# Patient Record
Sex: Male | Born: 1980 | Race: White | Hispanic: No | Marital: Single | Smoking: Former smoker
Health system: Southern US, Community
[De-identification: ages and names within clinical notes are randomized; demographics above are authoritative.]

---

## 2005-07-23 ENCOUNTER — Emergency Department: Payer: Self-pay | Admitting: Emergency Medicine

## 2005-07-30 ENCOUNTER — Emergency Department: Payer: Self-pay | Admitting: General Practice

## 2005-09-25 ENCOUNTER — Emergency Department: Payer: Self-pay | Admitting: Emergency Medicine

## 2005-10-02 ENCOUNTER — Emergency Department: Payer: Self-pay | Admitting: Emergency Medicine

## 2006-01-06 ENCOUNTER — Emergency Department: Payer: Self-pay | Admitting: Internal Medicine

## 2006-03-02 ENCOUNTER — Emergency Department: Payer: Self-pay | Admitting: Emergency Medicine

## 2006-03-09 ENCOUNTER — Emergency Department: Payer: Self-pay | Admitting: Internal Medicine

## 2006-03-11 ENCOUNTER — Ambulatory Visit: Payer: Self-pay | Admitting: Family Medicine

## 2006-03-24 ENCOUNTER — Ambulatory Visit: Payer: Self-pay | Admitting: Family Medicine

## 2006-03-28 ENCOUNTER — Other Ambulatory Visit: Payer: Self-pay

## 2006-03-28 ENCOUNTER — Emergency Department: Payer: Self-pay | Admitting: Internal Medicine

## 2006-03-30 ENCOUNTER — Ambulatory Visit: Payer: Self-pay | Admitting: Internal Medicine

## 2006-03-31 ENCOUNTER — Inpatient Hospital Stay: Payer: Self-pay | Admitting: Internal Medicine

## 2006-04-05 ENCOUNTER — Emergency Department: Payer: Self-pay | Admitting: Emergency Medicine

## 2006-04-17 ENCOUNTER — Other Ambulatory Visit: Payer: Self-pay

## 2006-04-17 ENCOUNTER — Emergency Department: Payer: Self-pay | Admitting: Unknown Physician Specialty

## 2006-08-15 ENCOUNTER — Emergency Department: Payer: Self-pay

## 2006-08-17 ENCOUNTER — Emergency Department: Payer: Self-pay | Admitting: Emergency Medicine

## 2006-08-22 ENCOUNTER — Emergency Department: Payer: Self-pay | Admitting: Emergency Medicine

## 2006-08-24 ENCOUNTER — Emergency Department: Payer: Self-pay | Admitting: Emergency Medicine

## 2006-09-02 ENCOUNTER — Emergency Department: Payer: Self-pay | Admitting: Emergency Medicine

## 2006-09-06 ENCOUNTER — Emergency Department: Payer: Self-pay | Admitting: Emergency Medicine

## 2006-09-06 ENCOUNTER — Other Ambulatory Visit: Payer: Self-pay

## 2006-10-26 ENCOUNTER — Emergency Department: Payer: Self-pay | Admitting: Emergency Medicine

## 2006-11-02 ENCOUNTER — Emergency Department: Payer: Self-pay | Admitting: Emergency Medicine

## 2007-06-07 ENCOUNTER — Emergency Department: Payer: Self-pay | Admitting: Emergency Medicine

## 2007-09-23 ENCOUNTER — Emergency Department: Payer: Self-pay | Admitting: Emergency Medicine

## 2008-05-11 ENCOUNTER — Emergency Department: Payer: Self-pay | Admitting: Emergency Medicine

## 2008-05-12 ENCOUNTER — Emergency Department: Payer: Self-pay | Admitting: Emergency Medicine

## 2008-05-14 ENCOUNTER — Emergency Department: Payer: Self-pay | Admitting: Internal Medicine

## 2008-05-15 ENCOUNTER — Inpatient Hospital Stay: Payer: Self-pay | Admitting: Internal Medicine

## 2008-05-21 ENCOUNTER — Emergency Department: Payer: Self-pay | Admitting: Emergency Medicine

## 2008-10-02 IMAGING — CT CT CERVICAL SPINE WITHOUT CONTRAST
1 series · 12 of 14 positions shown, 15 images · non-contrast
Comparison: none

REASON FOR EXAM: fall/pain
COMMENTS:

[Series 6: axial · axial · 0.19mm/px · z∈[+243,+400]mm · 12 of 100 slices shown, 15 images]
[im 8/100  soft-tissue]
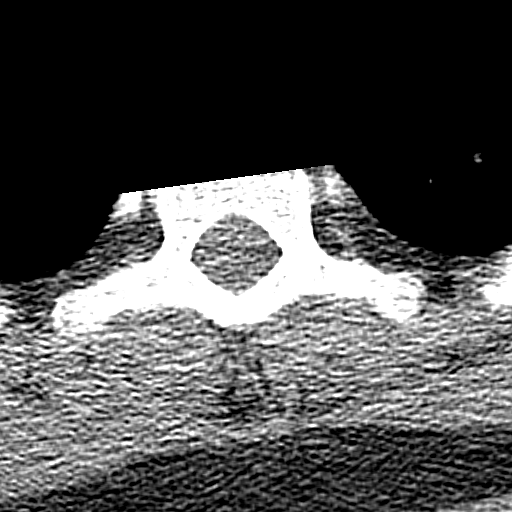
[im 8/100  bone]
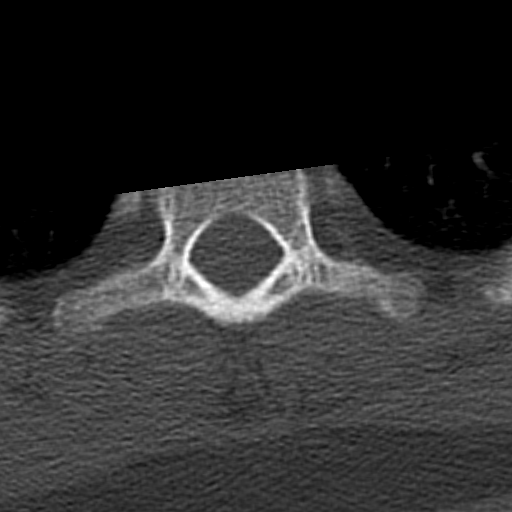
[im 16/100  bone]
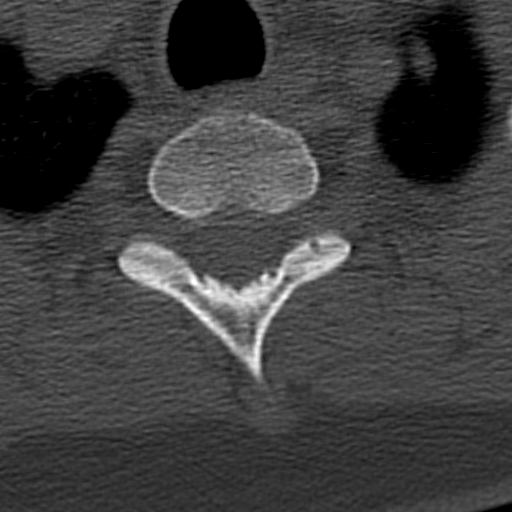
[im 23/100  bone]
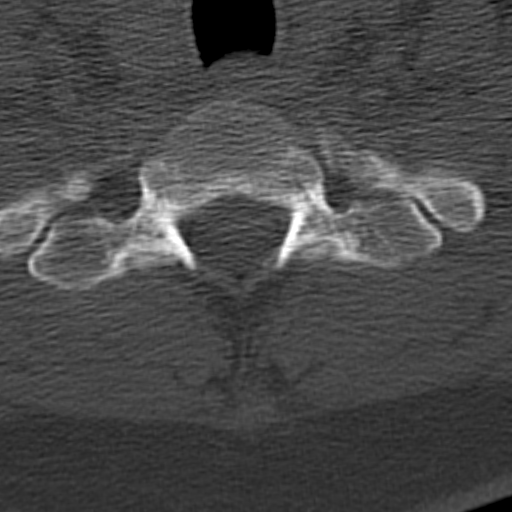
[im 31/100  bone]
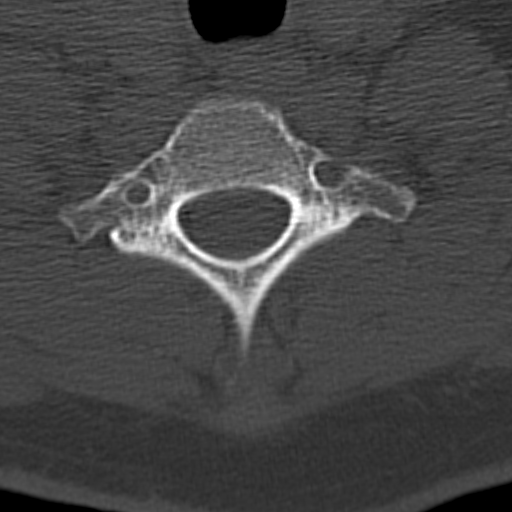
[im 39/100  soft-tissue]
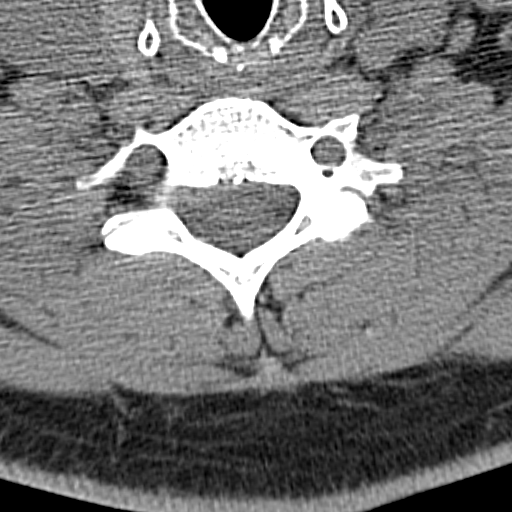
[im 39/100  bone]
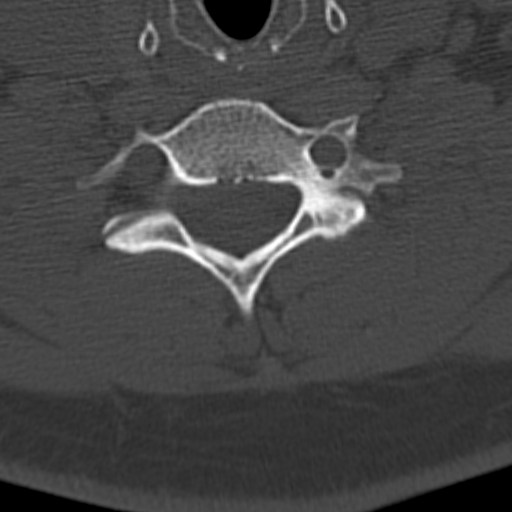
[im 46/100  bone]
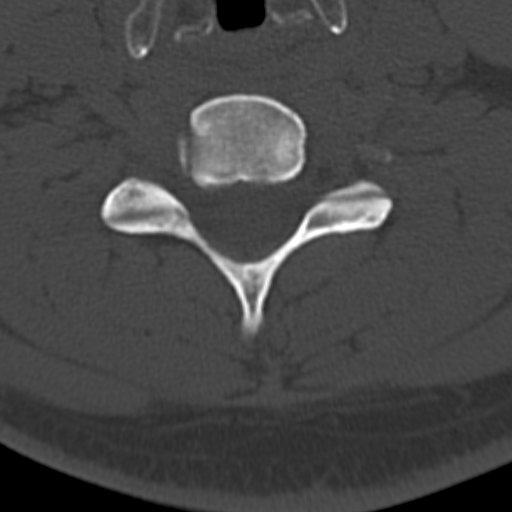
[im 54/100  bone]
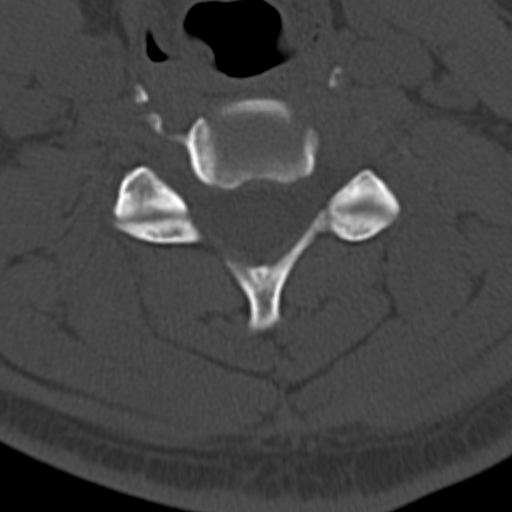
[im 61/100  bone]
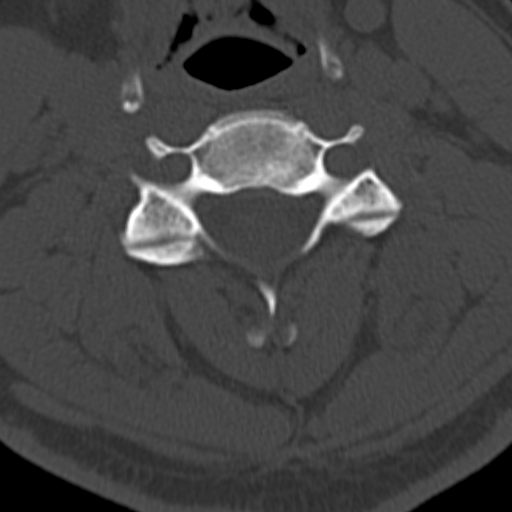
[im 69/100  soft-tissue]
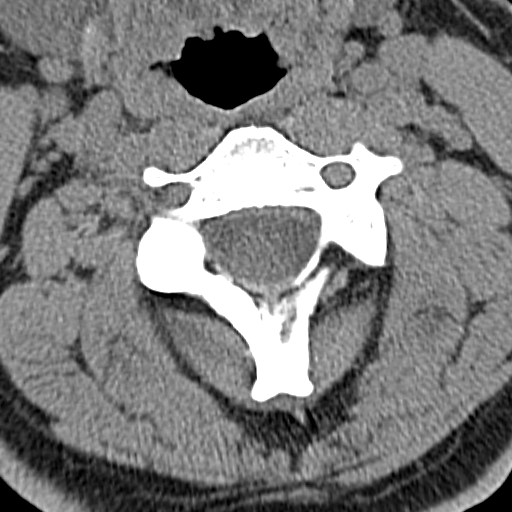
[im 69/100  bone]
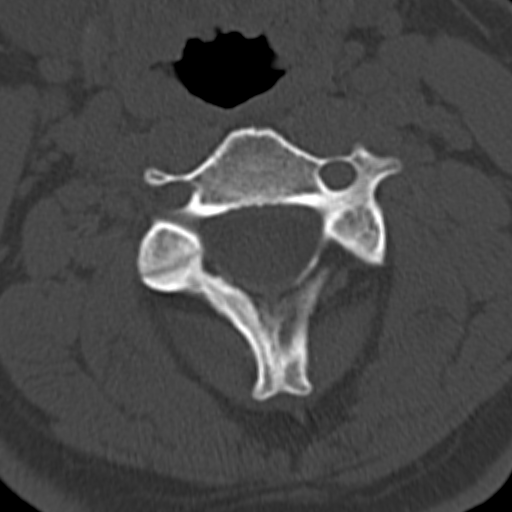
[im 77/100  bone]
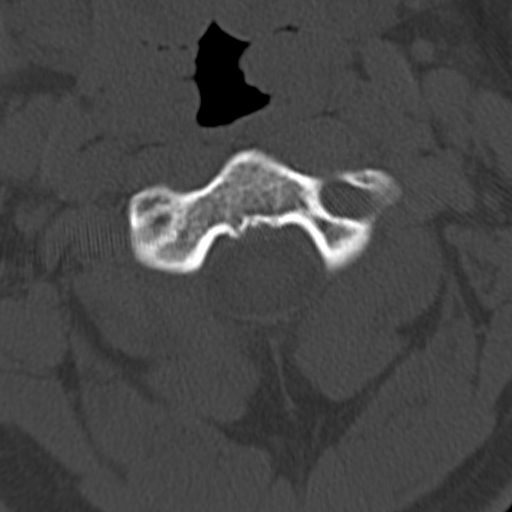
[im 84/100  bone]
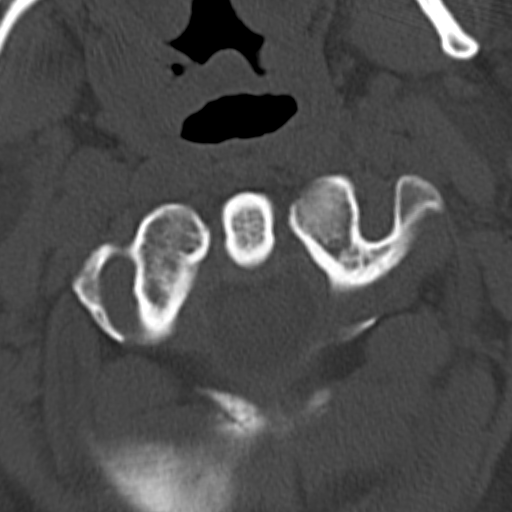
[im 92/100  bone]
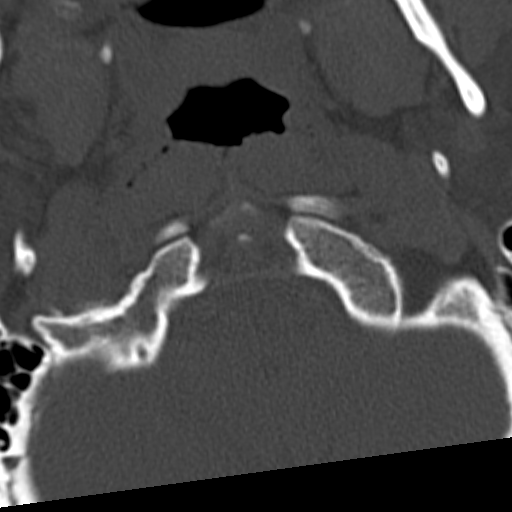

[12 of 14 positions shown; findings below may reference images not displayed]

PROCEDURE:     CT  - CT CERVICAL SPINE WO  - August 22, 2006  [DATE]

RESULT:     CT of the cervical spine is performed. The patient has a prior
study dated 03/28/2006. The study is obtained utilizing a multislight helical
acquisition with axial, coronal and sagittal reconstructions in bone window
setting being submitted. The prevertebral soft tissues and spinal alignment
are normal. The facets appear to be unremarkable. The posterior elements are
intact. The craniocervical junction and spinous processes appear to be
normal.
IMPRESSION: No CT evidence of acute cervical spine fracture. No
subluxation evident. The included upper thoracic spine demonstrates no
definite fracture.

## 2008-10-02 IMAGING — CR DG THORACIC SPINE 2-3V
1 series · 4 of 4 positions shown · non-contrast
Comparison: none

REASON FOR EXAM: fall/pain
COMMENTS:

PROCEDURE:     DXR - DXR THORACIC  AP AND LATERAL  - August 22, 2006  [DATE]
RESULT:     AP and lateral images of the thoracic spine show normal
alignment with no fracture or subluxation.

[Series 1: view not recorded · 0.17mm/px · 4 of 4 slices shown]
[im 1/4]
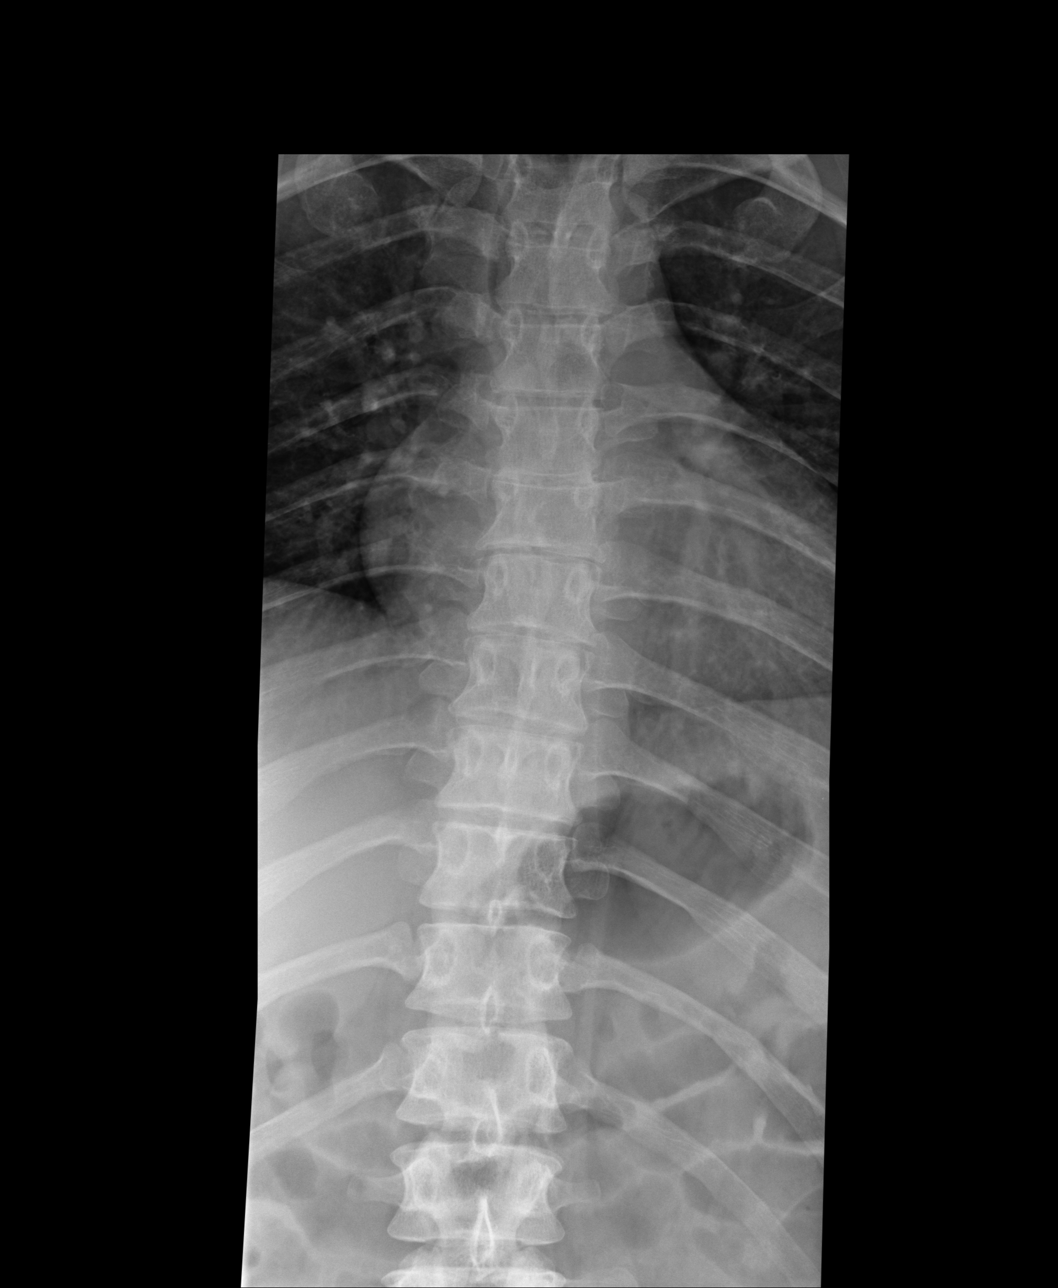
[im 2/4]
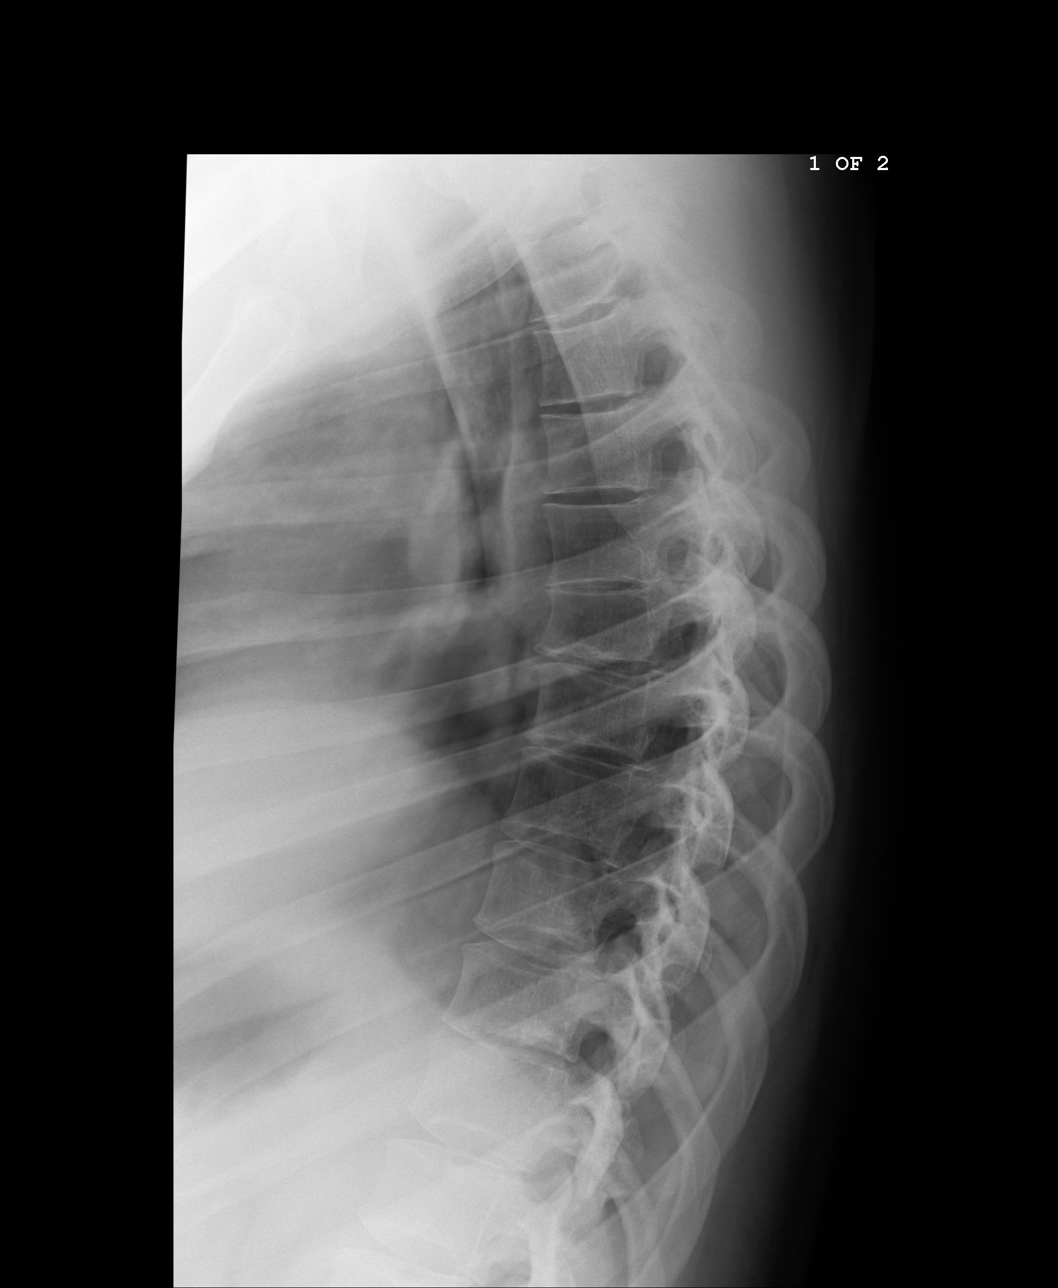
[im 3/4]
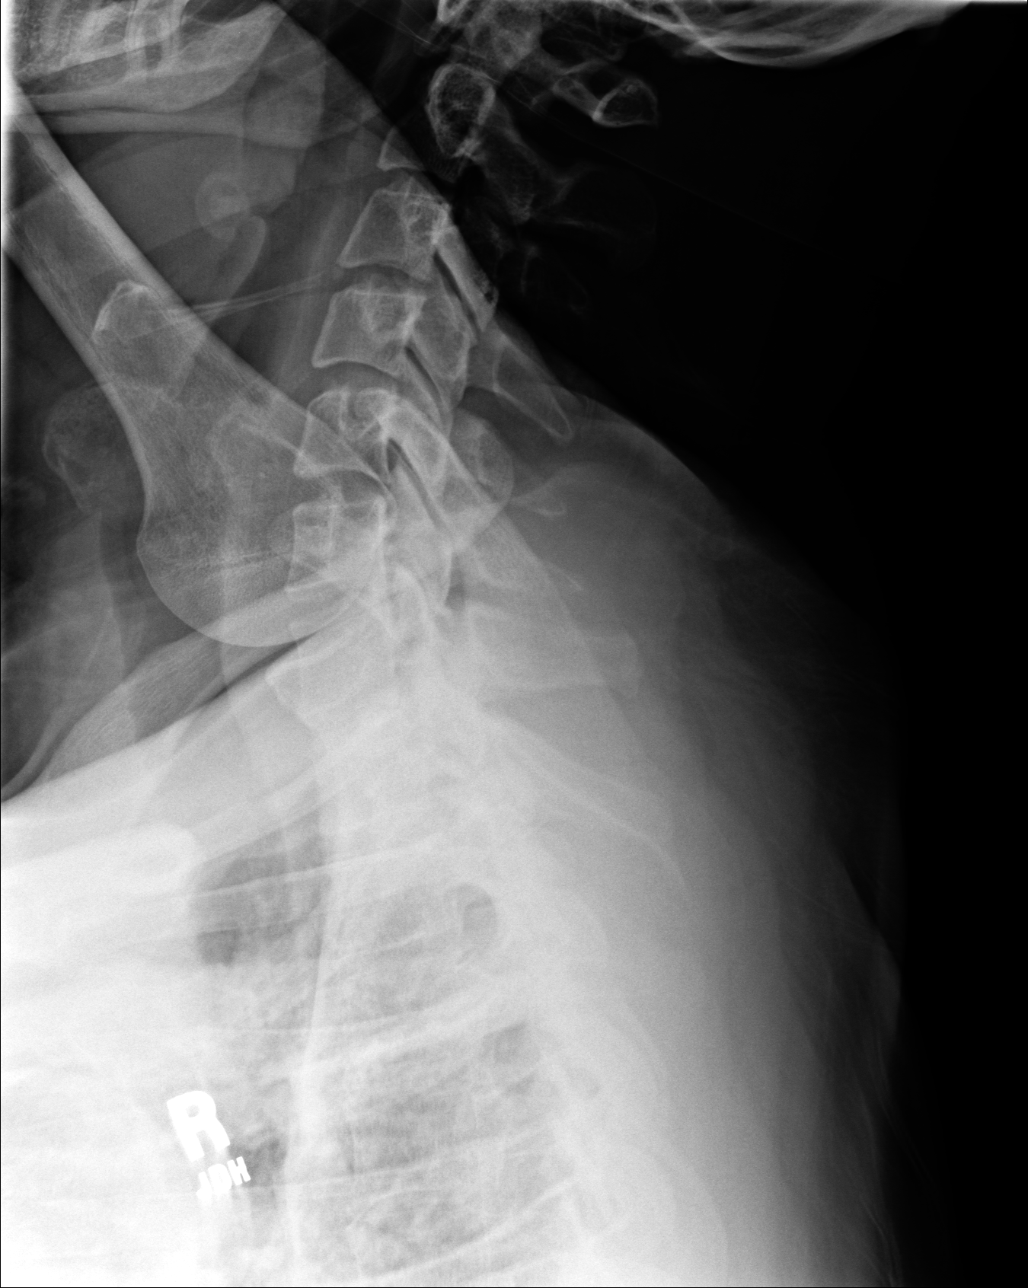
[im 4/4]
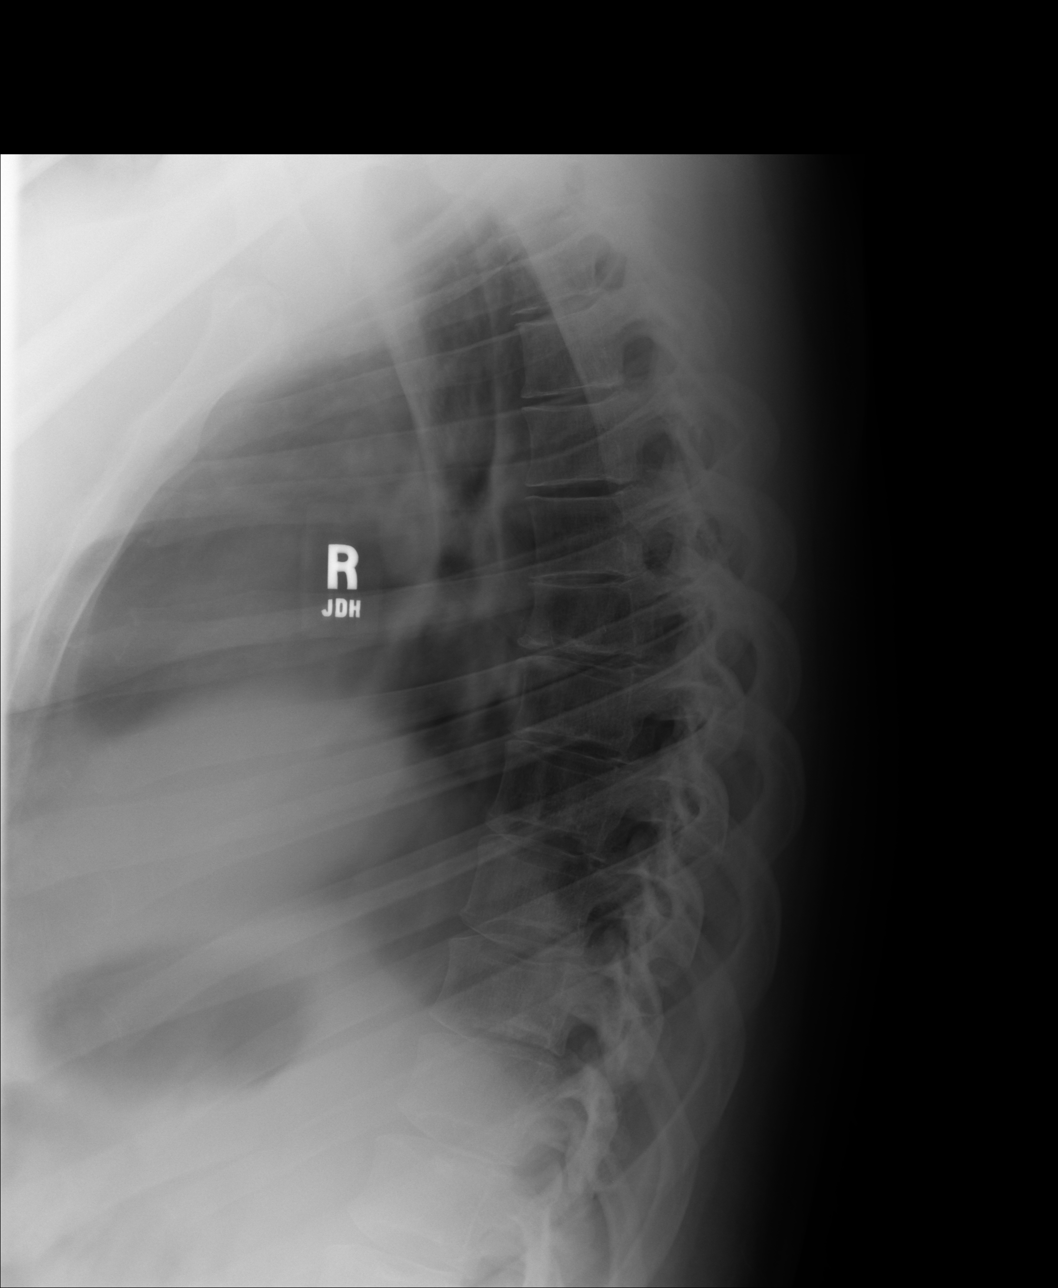

[4 of 4 positions shown; findings below may reference images not displayed]

IMPRESSION: No acute bony abnormality demonstrated.

## 2008-10-02 IMAGING — CR DG LUMBAR SPINE AP/LAT/OBLIQUES W/ FLEX AND EXT
1 series · 5 of 5 positions shown · non-contrast
Comparison: none

REASON FOR EXAM: fall/pain
COMMENTS:

[Series 1: view not recorded · 0.17mm/px · 5 of 5 slices shown]
[im 1/5]
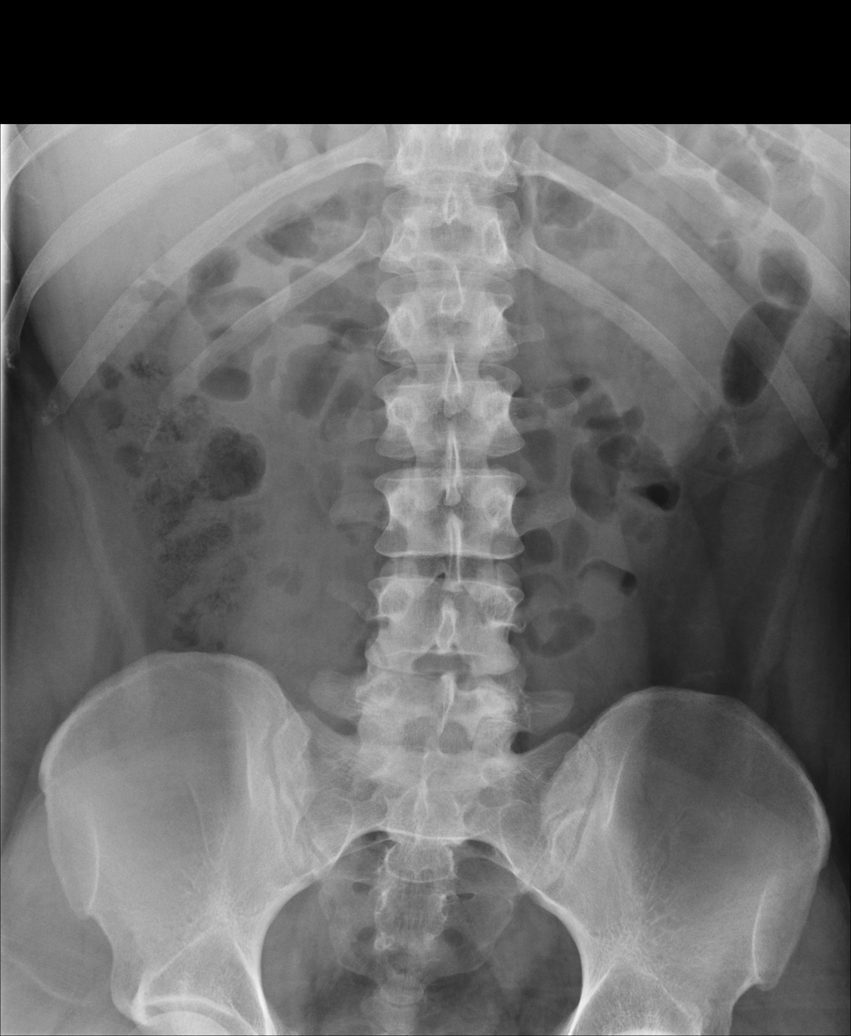
[im 2/5]
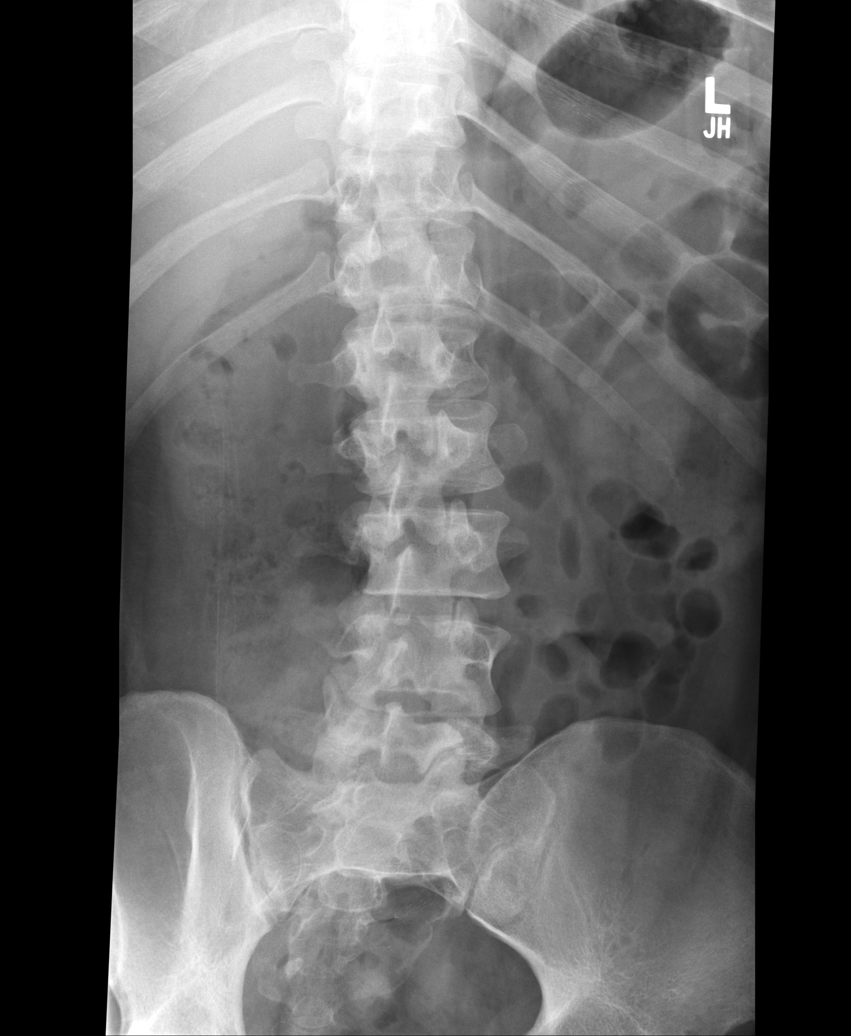
[im 3/5]
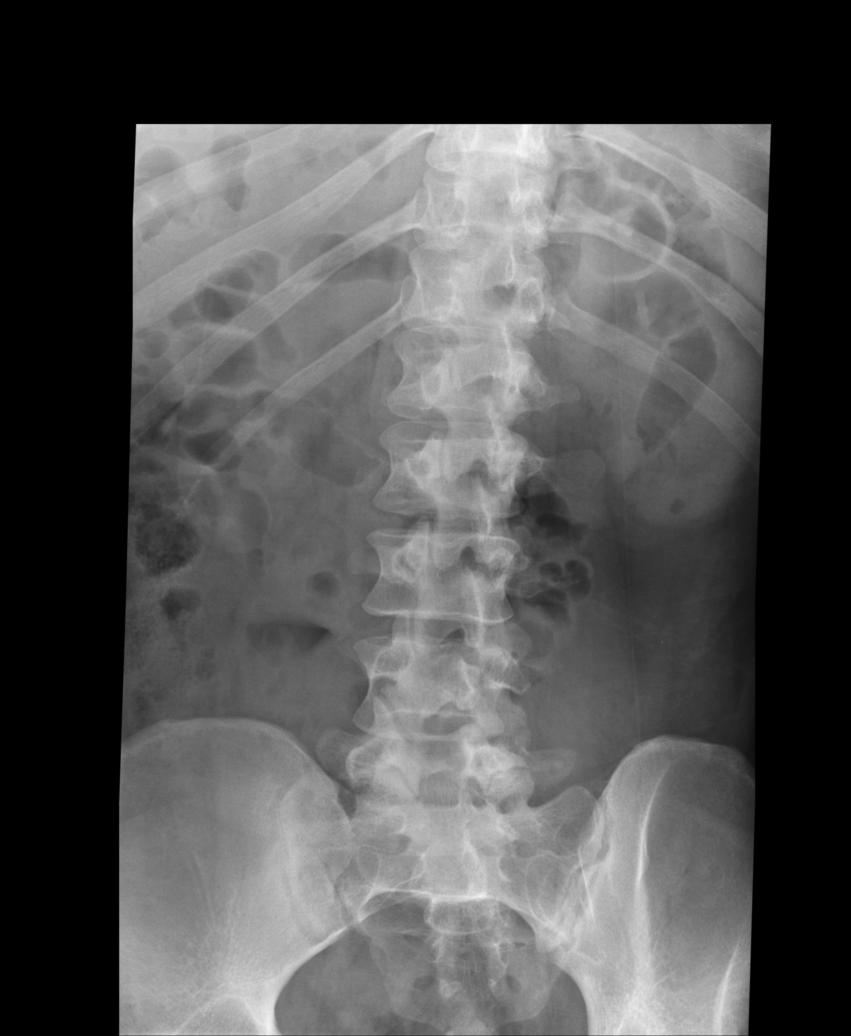
[im 4/5]
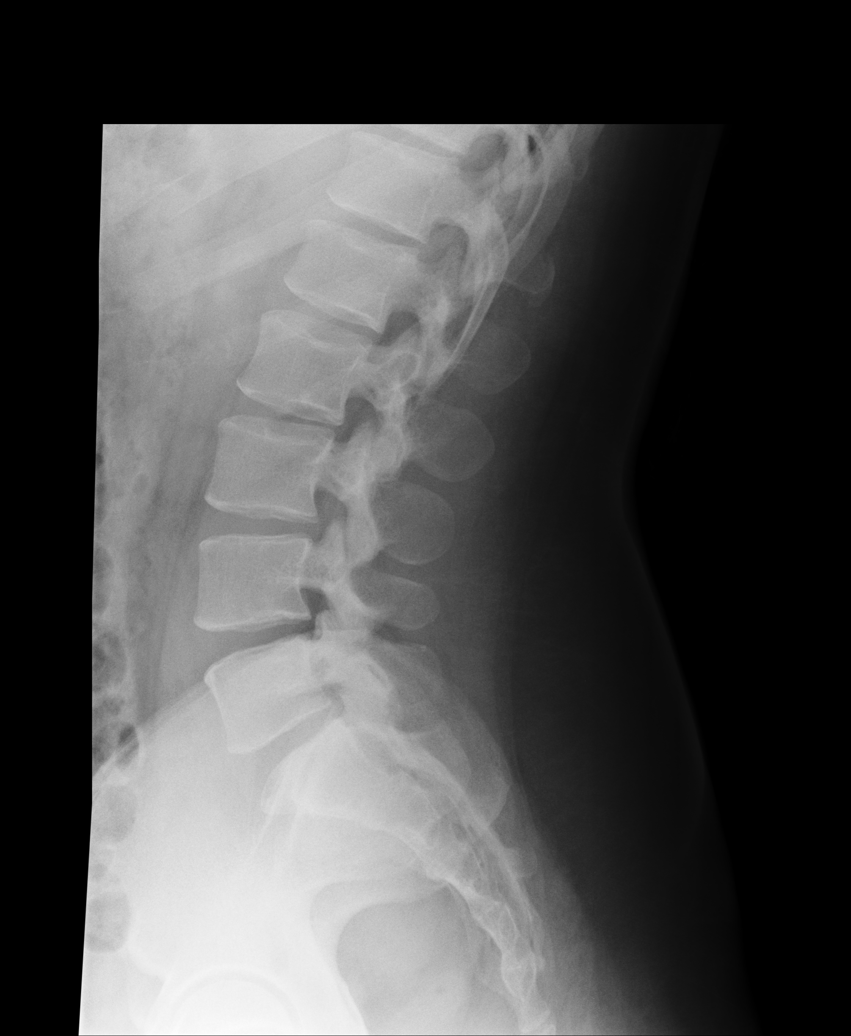
[im 5/5]
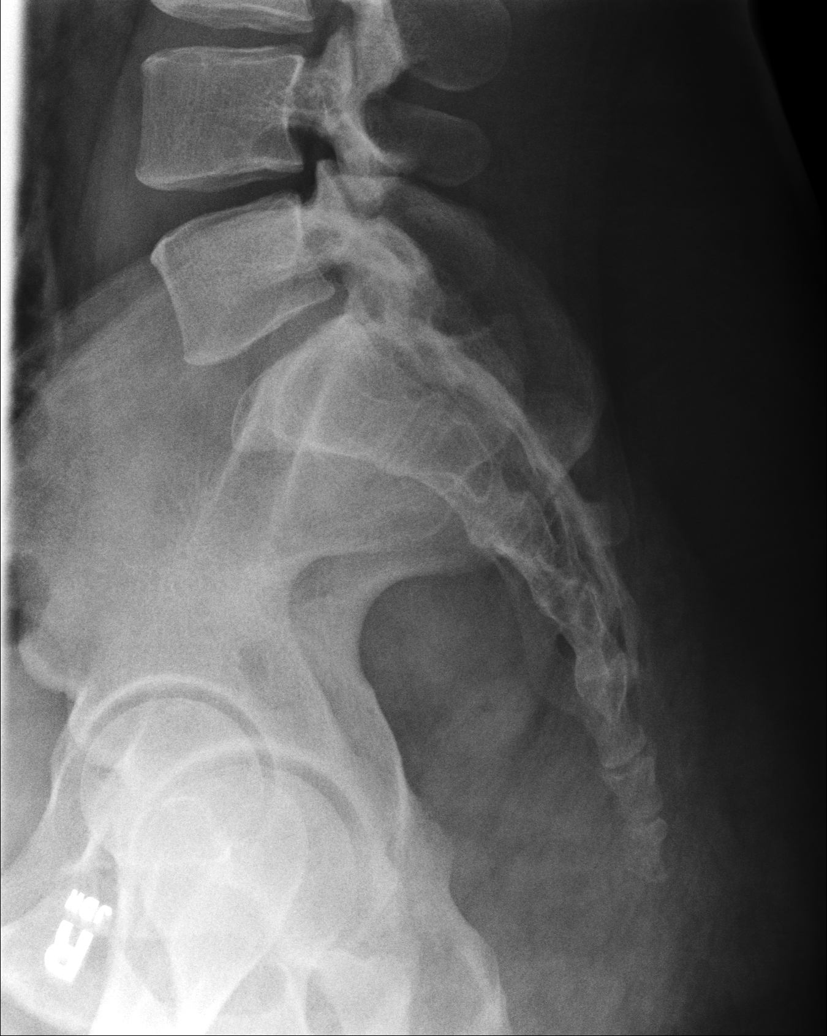

[5 of 5 positions shown; findings below may reference images not displayed]

PROCEDURE:     DXR - DXR LUMBAR SPINE WITH OBLIQUES  - August 22, 2006  [DATE]

RESULT:     Images of the lumbar spine demonstrate normal alignment. There
is minimal anterior wedging at T11 which is a normal variant. No definite
fracture is identified. There is no subluxation. The facets appear to be
normally aligned. The bowel gas pattern is unremarkable.
IMPRESSION: No acute bony abnormality.

## 2009-05-27 ENCOUNTER — Emergency Department: Payer: Self-pay | Admitting: Emergency Medicine

## 2012-06-04 ENCOUNTER — Emergency Department: Payer: Self-pay | Admitting: Emergency Medicine

## 2013-01-20 ENCOUNTER — Emergency Department: Payer: Self-pay | Admitting: Internal Medicine

## 2020-12-05 ENCOUNTER — Other Ambulatory Visit: Payer: Self-pay

## 2020-12-05 ENCOUNTER — Emergency Department: Payer: BC Managed Care – PPO

## 2020-12-05 ENCOUNTER — Emergency Department
Admission: EM | Admit: 2020-12-05 | Discharge: 2020-12-05 | Disposition: A | Discharge status: Home / Self Care | Payer: BC Managed Care – PPO | Attending: Emergency Medicine | Admitting: Emergency Medicine

## 2020-12-05 DIAGNOSIS — R0789 Other chest pain: Secondary | ICD-10-CM | POA: Insufficient documentation

## 2020-12-05 DIAGNOSIS — Z87891 Personal history of nicotine dependence: Secondary | ICD-10-CM | POA: Diagnosis not present

## 2020-12-05 DIAGNOSIS — R945 Abnormal results of liver function studies: Secondary | ICD-10-CM | POA: Diagnosis not present

## 2020-12-05 DIAGNOSIS — R1011 Right upper quadrant pain: Secondary | ICD-10-CM | POA: Diagnosis present

## 2020-12-05 DIAGNOSIS — R748 Abnormal levels of other serum enzymes: Secondary | ICD-10-CM

## 2020-12-05 LAB — CBC
HCT: 38.4 % — ABNORMAL LOW (ref 39.0–52.0)
Hemoglobin: 13.8 g/dL (ref 13.0–17.0)
MCH: 32.7 pg (ref 26.0–34.0)
MCHC: 35.9 g/dL (ref 30.0–36.0)
MCV: 91 fL (ref 80.0–100.0)
Platelets: 98 10*3/uL — ABNORMAL LOW (ref 150–400)
RBC: 4.22 MIL/uL (ref 4.22–5.81)
RDW: 15.3 % (ref 11.5–15.5)
WBC: 5.5 10*3/uL (ref 4.0–10.5)
nRBC: 0 % (ref 0.0–0.2)

## 2020-12-05 LAB — HEPATIC FUNCTION PANEL
ALT: 31 U/L (ref 0–44)
AST: 87 U/L — ABNORMAL HIGH (ref 15–41)
Albumin: 4 g/dL (ref 3.5–5.0)
Alkaline Phosphatase: 148 U/L — ABNORMAL HIGH (ref 38–126)
Bilirubin, Direct: 1.9 mg/dL — ABNORMAL HIGH (ref 0.0–0.2)
Indirect Bilirubin: 2.4 mg/dL — ABNORMAL HIGH (ref 0.3–0.9)
Total Bilirubin: 4.3 mg/dL — ABNORMAL HIGH (ref 0.3–1.2)
Total Protein: 8.2 g/dL — ABNORMAL HIGH (ref 6.5–8.1)

## 2020-12-05 LAB — TROPONIN I (HIGH SENSITIVITY)
Troponin I (High Sensitivity): 4 ng/L (ref <18)
Troponin I (High Sensitivity): 2 ng/L (ref <18)

## 2020-12-05 LAB — BASIC METABOLIC PANEL
Anion gap: 10 (ref 5–15)
BUN: 6 mg/dL (ref 6–20)
CO2: 28 mmol/L (ref 22–32)
Calcium: 9.2 mg/dL (ref 8.9–10.3)
Chloride: 96 mmol/L — ABNORMAL LOW (ref 98–111)
Creatinine, Ser: 0.93 mg/dL (ref 0.61–1.24)
GFR, Estimated: >60 mL/min (ref >60)
Glucose, Bld: 127 mg/dL — ABNORMAL HIGH (ref 70–99)
Potassium: 3.3 mmol/L — ABNORMAL LOW (ref 3.5–5.1)
Sodium: 134 mmol/L — ABNORMAL LOW (ref 135–145)

## 2020-12-05 LAB — LIPASE, BLOOD: Lipase: 30 U/L (ref 11–51)

## 2020-12-05 MED ORDER — LIDOCAINE VISCOUS HCL 2 % MT SOLN
15.0000 mL | Freq: Once | OROMUCOSAL | Status: AC
  Administered 2020-12-05: 15 mL via ORAL
  Filled 2020-12-05: qty 15

## 2020-12-05 MED ORDER — IOHEXOL 350 MG/ML SOLN
100.0000 mL | Freq: Once | INTRAVENOUS | Status: AC | PRN
  Administered 2020-12-05: 100 mL via INTRAVENOUS
  Filled 2020-12-05: qty 100

## 2020-12-05 MED ORDER — ALUM & MAG HYDROXIDE-SIMETH 200-200-20 MG/5ML PO SUSP
30.0000 mL | Freq: Once | ORAL | Status: AC
  Administered 2020-12-05: 30 mL via ORAL
  Filled 2020-12-05: qty 30

## 2020-12-05 MED ORDER — PANTOPRAZOLE SODIUM 40 MG PO TBEC: 40.0000 mg | DELAYED_RELEASE_TABLET | Freq: Every day | ORAL | 0 refills | Status: DC

## 2020-12-05 MED ORDER — ONDANSETRON 4 MG PO TBDP: 4.0000 mg | ORAL_TABLET | Freq: Three times a day (TID) | ORAL | 0 refills | Status: DC | PRN

## 2020-12-05 NOTE — Discharge Instructions (Addendum)
As we discussed, your pain could very well be due to gallstones.  I have attached instructions on how to adjust her diet to reduce the likelihood of this being from gallstones.  Your liver enzymes were elevated as well as her bilirubin.  I suspect this is related to underlying liver disease from alcohol use as well as possibly diet.  That being said, it is very important to follow-up with a GI doctor for close follow-up and evaluation for possible cirrhosis.  You should do this within the next several weeks.  I provided a follow-up appointment number.  Otherwise, try to avoid spicy foods and foods high in fat.  Take the antacid.  You can take over-the-counter Tums as well for pain.  AVOID tylenol, alcohol

## 2020-12-05 NOTE — ED Provider Notes (Signed)
[facility] Emergency Department Provider Note  ____________________________________________   Event Date/Time   First MD Initiated Contact with Patient 12/05/20 [phone]     (approximate)  I have reviewed the triage vital signs and the nursing notes.   HISTORY  Chief Complaint Chest Pain    HPI Anthony Torres is a 40 y.o. male here with chest pain.  The patient states that starting yesterday, he developed a dull, pressure-like chest pain with some associated nausea.  This began after eating at work and persisted intermittently throughout the day.  It was not necessarily associated with exertion or even eating.  Patient states that he went back to work today, and while he did not have chest pain initially, he did develop fairly cute onset of feeling of lightheadedness, along with some tingling in his bilateral arms.  He also had some mild, aching, chest pain that radiated to his back.  This has been fairly persistent.  Denies history of similar issues.  No known family history of early coronary disease.  He does note that he has been under recent stress.  He does take NSAIDs as well regularly with history of reflux but denies known history of ulcers.  No melena.  He currently feels almost back to baseline spontaneously.  No fevers.  No chills.  No leg swelling.  No history of PE.    History reviewed. No pertinent past medical history.  There are no problems to display for this patient.   History reviewed. No pertinent surgical history.  Prior to Admission medications   Medication Sig Start Date End Date Taking? Authorizing Provider  ondansetron (ZOFRAN ODT) 4 MG disintegrating tablet Take 1 tablet (4 mg total) by mouth every 8 (eight) hours as needed for nausea or vomiting. 12/05/20  Yes Shaune Pollack, MD  pantoprazole (PROTONIX) 40 MG tablet Take 1 tablet (40 mg total) by mouth daily for 14 days. 12/05/20 12/19/20 Yes Shaune Pollack, MD     Allergies Patient has no allergy information on record.  History reviewed. No pertinent family history.  Social History Social History   Tobacco Use   Smoking status: Former    Types: Cigarettes    Quit date: 2019    Years since quitting: 3.8   Smokeless tobacco: Never  Vaping Use   Vaping Use: Never used  Substance Use Topics   Alcohol use: Yes   Drug use: Never    Review of Systems  Review of Systems  Constitutional:  Negative for chills and fever.  HENT:  Negative for sore throat.   Respiratory:  Positive for chest tightness and shortness of breath.   Cardiovascular:  Positive for chest pain.  Gastrointestinal:  Negative for abdominal pain.  Genitourinary:  Negative for flank pain.  Musculoskeletal:  Negative for neck pain.  Skin:  Negative for rash and wound.  Allergic/Immunologic: Negative for immunocompromised state.  Neurological:  Negative for weakness and numbness.  Hematological:  Does not bruise/bleed easily.  All other systems reviewed and are negative.   ____________________________________________  PHYSICAL EXAM:      VITAL SIGNS: ED Triage Vitals  Enc Vitals Group     BP 12/05/20 0844 (!) 149/94     Pulse Rate 12/05/20 0844 96     Resp 12/05/20 0844 18     Temp 12/05/20 0844 98.5 F (36.9 C)     Temp Source 12/05/20 0844 Oral     SpO2 12/05/20 0844 98 %     Weight 12/05/20 6433  280 lb (127 kg)     Height 12/05/20 0838 5\' 10"  (1.778 m)     Head Circumference --      Peak Flow --      Pain Score 12/05/20 0844 6     Pain Loc --      Pain Edu? --      Excl. in GC? --      Physical Exam Vitals and nursing note reviewed.  Constitutional:      General: He is not in acute distress.    Appearance: He is well-developed.  HENT:     Head: Normocephalic and atraumatic.  Eyes:     Conjunctiva/sclera: Conjunctivae normal.  Cardiovascular:     Rate and Rhythm: Normal rate and regular rhythm.     Heart sounds: Normal heart sounds. No murmur  heard.   No friction rub.  Pulmonary:     Effort: Pulmonary effort is normal. No respiratory distress.     Breath sounds: Normal breath sounds. No wheezing or rales.  Abdominal:     General: There is no distension.     Palpations: Abdomen is soft.     Tenderness: There is no abdominal tenderness.  Musculoskeletal:     Cervical back: Neck supple.  Skin:    General: Skin is warm.     Capillary Refill: Capillary refill takes less than 2 seconds.  Neurological:     Mental Status: He is alert and oriented to person, place, and time.     Motor: No abnormal muscle tone.      ____________________________________________   LABS (all labs ordered are listed, but only abnormal results are displayed)  Labs Reviewed  BASIC METABOLIC PANEL - Abnormal; Notable for the following components:      Result Value   Sodium 134 (*)    Potassium 3.3 (*)    Chloride 96 (*)    Glucose, Bld 127 (*)    All other components within normal limits  CBC - Abnormal; Notable for the following components:   HCT 38.4 (*)    Platelets 98 (*)    All other components within normal limits  HEPATIC FUNCTION PANEL - Abnormal; Notable for the following components:   Total Protein 8.2 (*)    AST 87 (*)    Alkaline Phosphatase 148 (*)    Total Bilirubin 4.3 (*)    Bilirubin, Direct 1.9 (*)    Indirect Bilirubin 2.4 (*)    All other components within normal limits  LIPASE, BLOOD  TROPONIN I (HIGH SENSITIVITY)  TROPONIN I (HIGH SENSITIVITY)    ____________________________________________  EKG: Normal sinus rhythm, ventricular rate 88.  PR 146, QRS 90, QTc 452.  No acute ST elevations.  Patient does have some nonspecific ST depressions in the lateral precordial leads without concomitant changes.  This appears different from his prior EKG  __________________  RADIOLOGY All imaging, including plain films, CT scans, and ultrasounds, independently reviewed by me, and interpretations confirmed via formal  radiology reads.  ED MD interpretation:   Chest x-ray: No acute disease.  Official radiology report(s): DG Chest 2 View  Result Date: 12/05/2020 CLINICAL DATA:  Chest pain EXAM: CHEST - 2 VIEW COMPARISON:  None. FINDINGS: The heart size and mediastinal contours are within normal limits. Both lungs are clear. The visualized skeletal structures are unremarkable. IMPRESSION: No active cardiopulmonary disease. Reading location: [place], [place]. Electronically Signed   By: [place]   On: 12/05/2020 09:30   CT Angio Chest/Abd/Pel for Dissection W  and/or Wo Contrast  Result Date: 12/05/2020 CLINICAL DATA:  Chest and back pain, aortic dissection suspected EXAM: CT ANGIOGRAPHY CHEST, ABDOMEN AND PELVIS TECHNIQUE: Non-contrast CT of the chest was initially obtained. Multidetector CT imaging through the chest, abdomen and pelvis was performed using the standard protocol during bolus administration of intravenous contrast. Multiplanar reconstructed images and MIPs were obtained and reviewed to evaluate the vascular anatomy. CONTRAST:  OMNIPAQUE IOHEXOL 350 MG/ML SOLN COMPARISON:  None. FINDINGS: CTA CHEST FINDINGS Cardiovascular: Preferential opacification of the thoracic aorta. Normal contour and caliber of the thoracic aorta. No evidence of aneurysm, dissection, or other acute aortic pathology. No significant atherosclerosis. Normal heart size. No pericardial effusion. Mediastinum/Nodes: No enlarged mediastinal, hilar, or axillary lymph nodes. Thyroid gland, trachea, and esophagus demonstrate no significant findings. Lungs/Pleura: Lungs are clear. No pleural effusion or pneumothorax. Musculoskeletal: Bilateral gynecomastia. No acute or significant osseous findings. Review of the MIP images confirms the above findings. CTA ABDOMEN AND PELVIS FINDINGS VASCULAR Normal contour and caliber of the abdominal aorta. No evidence of aneurysm, dissection, or other aortic pathology. Standard branching  pattern of the abdominal aorta, with solitary bilateral renal arteries. No significant atherosclerosis. Review of the MIP images confirms the above findings. NON-VASCULAR Hepatobiliary: Hepatomegaly, maximum coronal span 26.1 cm, and hepatic steatosis. Somewhat coarse, nodular contour of the liver. Gallstones in the gallbladder. No gallbladder wall thickening or biliary ductal dilatation. Pancreas: Unremarkable. No pancreatic ductal dilatation or surrounding inflammatory changes. Spleen: Splenomegaly, maximum coronal span 17.7 cm. Adrenals/Urinary Tract: Adrenal glands are unremarkable. Kidneys are normal, without renal calculi, solid lesion, or hydronephrosis. Bladder is unremarkable. Stomach/Bowel: Stomach is within normal limits. Appendix appears normal. No evidence of bowel wall thickening, distention, or inflammatory changes. Lymphatic: No enlarged abdominal or pelvic lymph nodes. Reproductive: No mass or other significant abnormality. Other: Small, fat containing right inguinal hernia. No abdominopelvic ascites. Musculoskeletal: No acute or significant osseous findings. Review of the MIP images confirms the above findings. IMPRESSION: 1. Normal contour and caliber of the thoracic and abdominal aorta. No evidence of aneurysm, dissection, or other acute aortic pathology. No significant atherosclerosis. 2. Hepatomegaly and hepatic steatosis. Somewhat coarse, nodular contour of the liver, suggestive of cirrhosis. 3. Splenomegaly. 4. Cholelithiasis without evidence of acute cholecystitis. Electronically Signed   By: Jearld Lesch M.D.   On: 12/05/2020 11:46    ____________________________________________  PROCEDURES   Procedure(s) performed (including Critical Care):  Procedures  ____________________________________________  INITIAL IMPRESSION / MDM / ASSESSMENT AND PLAN / ED COURSE  As part of my medical decision making, I reviewed the following data within the electronic MEDICAL RECORD NUMBER Nursing  notes reviewed and incorporated, Old chart reviewed, Notes from prior ED visits, and [place] Controlled Substance Database       *Anthony Torres was evaluated in Emergency Department on 12/05/2020 for the symptoms described in the history of present illness. He was evaluated in the context of the global COVID-19 pandemic, which necessitated consideration that the patient might be at risk for infection with the SARS-CoV-2 virus that causes COVID-19. Institutional protocols and algorithms that pertain to the evaluation of patients at risk for COVID-19 are in a state of rapid change based on information released by regulatory bodies including the CDC and federal and state organizations. These policies and algorithms were followed during the patient's care in the ED.  Some ED evaluations and interventions may be delayed as a result of limited staffing during the pandemic.*     Medical Decision Making: 40 year old male with  past medical history as above here with fairly atypical chest pain.  Patient has had resolution here in the ED.  EKG does show some nonspecific changes but troponins negative x2 with low risk heart score and I do not suspect ACS clinically.  More likely, I suspect patient may have gastritis or GI related pain.  He does have cholelithiasis noted on CT scan, as well as slight elevation of AST and bilirubin but patient also has a history of fairly heavy alcohol use and poor diet which I suspect has caused alcoholic as well as possibly steatosis of the liver, and he has no right upper quadrant tenderness or evidence of Murphy sign.  Given the location of his pain and radiation to his back with initial hypertension, differential included dissection for which the dissection study was obtained.  I independently reviewed this which is overall unremarkable.  He has no evidence of coronary atherosclerosis here.  Gallbladder findings and hepatomegaly with findings of cirrhosis as above.  Chest x-ray clear.   Lipase normal.  Patient has had resolution of symptoms after GI cocktail.  As mentioned, given negative cardiac work-up, telemetry, troponins, do not suspect cardiac etiology and I suspect this is GI and possibly related to his underlying liver disease.  I discussed his liver findings with him in detail and refer him to GI for close follow-up.  He has already worked on decreasing his alcohol intake and Raunel continue to do so.  Avoid Tylenol.  Return precautions given.  ____________________________________________  FINAL CLINICAL IMPRESSION(S) / ED DIAGNOSES  Final diagnoses:  RUQ pain  Atypical chest pain  Abnormal liver enzymes     MEDICATIONS GIVEN DURING THIS VISIT:  Medications  alum & mag hydroxide-simeth (MAALOX/MYLANTA) 200-200-20 MG/5ML suspension 30 mL (30 mLs Oral Given 12/05/20 1029)    And  lidocaine (XYLOCAINE) 2 % viscous mouth solution 15 mL (15 mLs Oral Given 12/05/20 1029)  iohexol (OMNIPAQUE) 350 MG/ML injection 100 mL (100 mLs Intravenous Contrast Given 12/05/20 1111)     ED Discharge Orders          Ordered    pantoprazole (PROTONIX) 40 MG tablet  Daily        12/05/20 1313    ondansetron (ZOFRAN ODT) 4 MG disintegrating tablet  Every 8 hours PRN        12/05/20 1315             Note:  This document was prepared using Dragon voice recognition software and may include unintentional dictation errors.   Shaune Pollack, MD 12/05/20 [phone]

## 2020-12-05 NOTE — ED Triage Notes (Signed)
Pt arrived via pov from home. Pt reports sharp CP starting at 0630 this morning. Pain located in left chest and radiating to back. Ambulatory to triage, NAD noted at this time

## 2021-05-26 ENCOUNTER — Other Ambulatory Visit: Payer: Self-pay

## 2021-05-26 ENCOUNTER — Encounter: Payer: Self-pay | Admitting: Emergency Medicine

## 2021-05-26 ENCOUNTER — Emergency Department: Payer: BC Managed Care – PPO

## 2021-05-26 ENCOUNTER — Inpatient Hospital Stay
Admission: EM | Admit: 2021-05-26 | Discharge: 2021-05-31 | DRG: 433 | Disposition: A | Discharge status: Home / Self Care | Payer: BC Managed Care – PPO | Attending: Internal Medicine | Admitting: Internal Medicine

## 2021-05-26 DIAGNOSIS — E871 Hypo-osmolality and hyponatremia: Secondary | ICD-10-CM | POA: Diagnosis present

## 2021-05-26 DIAGNOSIS — D72828 Other elevated white blood cell count: Secondary | ICD-10-CM | POA: Diagnosis not present

## 2021-05-26 DIAGNOSIS — D638 Anemia in other chronic diseases classified elsewhere: Secondary | ICD-10-CM | POA: Diagnosis present

## 2021-05-26 DIAGNOSIS — K7011 Alcoholic hepatitis with ascites: Secondary | ICD-10-CM | POA: Diagnosis present

## 2021-05-26 DIAGNOSIS — E538 Deficiency of other specified B group vitamins: Secondary | ICD-10-CM | POA: Diagnosis present

## 2021-05-26 DIAGNOSIS — D539 Nutritional anemia, unspecified: Secondary | ICD-10-CM | POA: Diagnosis present

## 2021-05-26 DIAGNOSIS — E8809 Other disorders of plasma-protein metabolism, not elsewhere classified: Secondary | ICD-10-CM | POA: Diagnosis present

## 2021-05-26 DIAGNOSIS — K760 Fatty (change of) liver, not elsewhere classified: Secondary | ICD-10-CM | POA: Diagnosis present

## 2021-05-26 DIAGNOSIS — K649 Unspecified hemorrhoids: Secondary | ICD-10-CM | POA: Diagnosis present

## 2021-05-26 DIAGNOSIS — F1010 Alcohol abuse, uncomplicated: Secondary | ICD-10-CM | POA: Diagnosis not present

## 2021-05-26 DIAGNOSIS — E869 Volume depletion, unspecified: Secondary | ICD-10-CM | POA: Diagnosis present

## 2021-05-26 DIAGNOSIS — R160 Hepatomegaly, not elsewhere classified: Secondary | ICD-10-CM | POA: Diagnosis present

## 2021-05-26 DIAGNOSIS — Z87891 Personal history of nicotine dependence: Secondary | ICD-10-CM | POA: Diagnosis not present

## 2021-05-26 DIAGNOSIS — D538 Other specified nutritional anemias: Secondary | ICD-10-CM | POA: Diagnosis present

## 2021-05-26 DIAGNOSIS — K766 Portal hypertension: Secondary | ICD-10-CM | POA: Diagnosis present

## 2021-05-26 DIAGNOSIS — Z85840 Personal history of malignant neoplasm of eye: Secondary | ICD-10-CM | POA: Diagnosis not present

## 2021-05-26 DIAGNOSIS — R17 Unspecified jaundice: Secondary | ICD-10-CM

## 2021-05-26 DIAGNOSIS — E876 Hypokalemia: Secondary | ICD-10-CM | POA: Diagnosis present

## 2021-05-26 DIAGNOSIS — D72829 Elevated white blood cell count, unspecified: Secondary | ICD-10-CM | POA: Diagnosis not present

## 2021-05-26 DIAGNOSIS — K8020 Calculus of gallbladder without cholecystitis without obstruction: Secondary | ICD-10-CM | POA: Diagnosis present

## 2021-05-26 DIAGNOSIS — T380X5A Adverse effect of glucocorticoids and synthetic analogues, initial encounter: Secondary | ICD-10-CM | POA: Diagnosis not present

## 2021-05-26 DIAGNOSIS — F1020 Alcohol dependence, uncomplicated: Secondary | ICD-10-CM | POA: Diagnosis present

## 2021-05-26 DIAGNOSIS — K7010 Alcoholic hepatitis without ascites: Secondary | ICD-10-CM | POA: Diagnosis present

## 2021-05-26 DIAGNOSIS — Z79899 Other long term (current) drug therapy: Secondary | ICD-10-CM | POA: Diagnosis not present

## 2021-05-26 DIAGNOSIS — K7031 Alcoholic cirrhosis of liver with ascites: Principal | ICD-10-CM | POA: Diagnosis present

## 2021-05-26 DIAGNOSIS — D649 Anemia, unspecified: Secondary | ICD-10-CM | POA: Diagnosis present

## 2021-05-26 LAB — CBC WITH DIFFERENTIAL/PLATELET
Abs Immature Granulocytes: 0.41 10*3/uL — ABNORMAL HIGH (ref 0.00–0.07)
Basophils Absolute: 0.1 10*3/uL (ref 0.0–0.1)
Basophils Relative: 1 %
Eosinophils Absolute: 0.2 10*3/uL (ref 0.0–0.5)
Eosinophils Relative: 2 %
HCT: 26.9 % — ABNORMAL LOW (ref 39.0–52.0)
Hemoglobin: 9.2 g/dL — ABNORMAL LOW (ref 13.0–17.0)
Immature Granulocytes: 4 %
Lymphocytes Relative: 14 %
Lymphs Abs: 1.4 10*3/uL (ref 0.7–4.0)
MCH: 37.4 pg — ABNORMAL HIGH (ref 26.0–34.0)
MCHC: 34.2 g/dL (ref 30.0–36.0)
MCV: 109.3 fL — ABNORMAL HIGH (ref 80.0–100.0)
Monocytes Absolute: 0.9 10*3/uL (ref 0.1–1.0)
Monocytes Relative: 10 %
Neutro Abs: 6.8 10*3/uL (ref 1.7–7.7)
Neutrophils Relative %: 69 %
Platelets: 154 10*3/uL (ref 150–400)
RBC: 2.46 MIL/uL — ABNORMAL LOW (ref 4.22–5.81)
RDW: 18.3 % — ABNORMAL HIGH (ref 11.5–15.5)
WBC: 9.8 10*3/uL (ref 4.0–10.5)
nRBC: 0.2 % (ref 0.0–0.2)

## 2021-05-26 LAB — IRON AND TIBC
Iron: 134 ug/dL (ref 45–182)
Saturation Ratios: 81 % — ABNORMAL HIGH (ref 17.9–39.5)
TIBC: 165 ug/dL — ABNORMAL LOW (ref 250–450)
UIBC: 31 ug/dL

## 2021-05-26 LAB — COMPREHENSIVE METABOLIC PANEL
ALT: 32 U/L (ref 0–44)
AST: 250 U/L — ABNORMAL HIGH (ref 15–41)
Albumin: 2.3 g/dL — ABNORMAL LOW (ref 3.5–5.0)
Alkaline Phosphatase: 161 U/L — ABNORMAL HIGH (ref 38–126)
Anion gap: 13 (ref 5–15)
BUN: 13 mg/dL (ref 6–20)
CO2: 20 mmol/L — ABNORMAL LOW (ref 22–32)
Calcium: 8.4 mg/dL — ABNORMAL LOW (ref 8.9–10.3)
Chloride: 93 mmol/L — ABNORMAL LOW (ref 98–111)
Creatinine, Ser: UNDETERMINED mg/dL (ref 0.61–1.24)
Glucose, Bld: 101 mg/dL — ABNORMAL HIGH (ref 70–99)
Potassium: 3.3 mmol/L — ABNORMAL LOW (ref 3.5–5.1)
Sodium: 126 mmol/L — ABNORMAL LOW (ref 135–145)
Total Bilirubin: 33.7 mg/dL (ref 0.3–1.2)
Total Protein: 8.3 g/dL — ABNORMAL HIGH (ref 6.5–8.1)

## 2021-05-26 LAB — RETICULOCYTES
Immature Retic Fract: 12.3 % (ref 2.3–15.9)
RBC.: 2.46 MIL/uL — ABNORMAL LOW (ref 4.22–5.81)
Retic Count, Absolute: 79.7 10*3/uL (ref 19.0–186.0)
Retic Ct Pct: 3.2 % — ABNORMAL HIGH (ref 0.4–3.1)

## 2021-05-26 LAB — PROTIME-INR
INR: 1.5 — ABNORMAL HIGH (ref 0.8–1.2)
Prothrombin Time: 17.5 seconds — ABNORMAL HIGH (ref 11.4–15.2)

## 2021-05-26 LAB — URINALYSIS, COMPLETE (UACMP) WITH MICROSCOPIC
Glucose, UA: 50 mg/dL — AB
Ketones, ur: NEGATIVE mg/dL
Leukocytes,Ua: NEGATIVE
Nitrite: NEGATIVE
Protein, ur: 30 mg/dL — AB
Specific Gravity, Urine: 1.02 (ref 1.005–1.030)
Squamous Epithelial / HPF: NONE SEEN (ref 0–5)
pH: 5 (ref 5.0–8.0)

## 2021-05-26 LAB — APTT: aPTT: 48 seconds — ABNORMAL HIGH (ref 24–36)

## 2021-05-26 LAB — FOLATE: Folate: 3.1 ng/mL — ABNORMAL LOW (ref >5.9)

## 2021-05-26 LAB — HEPATITIS PANEL, ACUTE
HCV Ab: NONREACTIVE
Hep A IgM: NONREACTIVE
Hep B C IgM: NONREACTIVE
Hepatitis B Surface Ag: NONREACTIVE

## 2021-05-26 LAB — LIPASE, BLOOD: Lipase: 42 U/L (ref 11–51)

## 2021-05-26 LAB — ACETAMINOPHEN LEVEL: Acetaminophen (Tylenol), Serum: <10 ug/mL — ABNORMAL LOW (ref 10–30)

## 2021-05-26 LAB — VITAMIN B12: Vitamin B-12: 496 pg/mL (ref 180–914)

## 2021-05-26 LAB — LACTATE DEHYDROGENASE: LDH: 100 U/L (ref 98–192)

## 2021-05-26 LAB — ETHANOL: Alcohol, Ethyl (B): <10 mg/dL (ref <10)

## 2021-05-26 LAB — SALICYLATE LEVEL: Salicylate Lvl: UNDETERMINED mg/dL (ref 7.0–30.0)

## 2021-05-26 LAB — FERRITIN: Ferritin: 542 ng/mL — ABNORMAL HIGH (ref 24–336)

## 2021-05-26 LAB — HIV ANTIBODY (ROUTINE TESTING W REFLEX): HIV Screen 4th Generation wRfx: NONREACTIVE

## 2021-05-26 MED ORDER — FUROSEMIDE 10 MG/ML IJ SOLN
40.0000 mg | Freq: Once | INTRAMUSCULAR | Status: AC
Start: 2021-05-26 — End: 2021-05-26
  Administered 2021-05-26: 40 mg via INTRAVENOUS
  Filled 2021-05-26: qty 4

## 2021-05-26 MED ORDER — GADOBUTROL 1 MMOL/ML IV SOLN
10.0000 mL | Freq: Once | INTRAVENOUS | Status: AC | PRN
  Administered 2021-05-26: 10 mL via INTRAVENOUS

## 2021-05-26 MED ORDER — LORAZEPAM 1 MG PO TABS: 1.0000 mg | ORAL_TABLET | ORAL | Status: DC | PRN

## 2021-05-26 MED ORDER — ADULT MULTIVITAMIN W/MINERALS CH
1.0000 | ORAL_TABLET | Freq: Every day | ORAL | Status: DC
  Administered 2021-05-27 – 2021-05-31 (×5): 1 via ORAL
  Filled 2021-05-26 (×5): qty 1

## 2021-05-26 MED ORDER — THIAMINE HCL 100 MG/ML IJ SOLN: 100.0000 mg | Freq: Every day | INTRAMUSCULAR | Status: DC

## 2021-05-26 MED ORDER — THIAMINE HCL 100 MG PO TABS
100.0000 mg | ORAL_TABLET | Freq: Every day | ORAL | Status: DC
  Administered 2021-05-27 – 2021-05-31 (×5): 100 mg via ORAL
  Filled 2021-05-26 (×5): qty 1

## 2021-05-26 MED ORDER — LORAZEPAM 2 MG/ML IJ SOLN: 1.0000 mg | INTRAMUSCULAR | Status: DC | PRN

## 2021-05-26 MED ORDER — HEPARIN SODIUM (PORCINE) 5000 UNIT/ML IJ SOLN
5000.0000 [IU] | Freq: Three times a day (TID) | INTRAMUSCULAR | Status: DC
  Administered 2021-05-26 – 2021-05-27 (×4): 5000 [IU] via SUBCUTANEOUS
  Filled 2021-05-26 (×6): qty 1

## 2021-05-26 MED ORDER — SPIRONOLACTONE 25 MG PO TABS
100.0000 mg | ORAL_TABLET | Freq: Every day | ORAL | Status: DC
  Administered 2021-05-27: 100 mg via ORAL
  Filled 2021-05-26: qty 4

## 2021-05-26 MED ORDER — FOLIC ACID 1 MG PO TABS
1.0000 mg | ORAL_TABLET | Freq: Every day | ORAL | Status: DC
  Administered 2021-05-27 – 2021-05-31 (×5): 1 mg via ORAL
  Filled 2021-05-26 (×5): qty 1

## 2021-05-26 MED ORDER — LIDOCAINE 5 % EX PTCH
1.0000 | MEDICATED_PATCH | CUTANEOUS | Status: DC
  Administered 2021-05-26 – 2021-05-30 (×5): 1 via TRANSDERMAL
  Filled 2021-05-26 (×5): qty 1

## 2021-05-26 MED ORDER — SODIUM CHLORIDE 0.9 % IV SOLN: INTRAVENOUS | Status: DC

## 2021-05-26 MED ORDER — ONDANSETRON HCL 4 MG/2ML IJ SOLN: 4.0000 mg | Freq: Once | INTRAMUSCULAR | Status: DC

## 2021-05-26 NOTE — ED Notes (Addendum)
Pt presents to ED with c/o of lower back pain that has been ongoing for about a week and also states his co-workers have been telling him his "skin was turning yellow". 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 Pt also c/o of BLE swelling that has been ongoing for a few weeks as well. Pt also presents with a spider like rash that is blanchable, pt states this is not new but does report there are more spots that have came up over the past few weeks, pt denies pain or itching to this area .  Pt denies any significant medical HX to this RN other than taking GERD medication for reflux. ? ?Pt states significant alcohol HX for about 10 years but pt states he has slowed down his drinking significantly since July and states he has not had a drink in 4 days, pt denies any issues with withdrawals or DT at this time, no tremors or CIWA activity noted at this time. Pt denies drug use. Pt is A&Ox4. NAD noted. VSS at this time.  ?

## 2021-05-26 NOTE — Plan of Care (Signed)
  Problem: Education: Goal: Knowledge of General Education information Beaux improve Description: Including pain rating scale, medication(s)/side effects and non-pharmacologic comfort measures Outcome: Adequate for Discharge   Problem: Health Behavior/Discharge Planning: Goal: Ability to manage health-related needs Aquila improve Outcome: Adequate for Discharge   Problem: Clinical Measurements: Goal: Ability to maintain clinical measurements within normal limits Kraven improve Outcome: Adequate for Discharge Goal: Anay remain free from infection Outcome: Adequate for Discharge Goal: Diagnostic test results Jaiquan improve Outcome: Adequate for Discharge Goal: Respiratory complications Kuper improve Outcome: Adequate for Discharge Goal: Cardiovascular complication [place] be avoided Outcome: Adequate for Discharge   

## 2021-05-26 NOTE — ED Notes (Signed)
Pt extremely jaundiced; states he didn't notice until a co-worker mentioned it to him; mid L and R back aching started a week ago; reports urination has been intermittently less frequent and dark in color. Pt reports recently told his liver function resulted "a little elevated". Denies CP, SOB, and nausea currently. Pt's resp reg/unlabored; skin dry; sitting calmly on stretcher. Call bell within reach; stretcher locked low; rail up. ?

## 2021-05-26 NOTE — ED Notes (Signed)
MD made aware of GFR and creatinine not being able to be calculated in blood, MD changed CT orders to W/O contrast.  ?

## 2021-05-26 NOTE — ED Notes (Signed)
This RN acknowledged Zofran admin order but pt denies nauseous at this time. Zofran placed on"'hold" on the [facility], pt educated to let this RN know if he became nauseous.  ?

## 2021-05-26 NOTE — H&P (Signed)
?History and Physical  ? ? ?Patient: Anthony Torres JOA:[id number] DOB: 1980-11-26 ?DOA: 05/26/2021 ?DOS: the patient was seen and examined on 05/26/2021 ?PCP: Pcp, No  ?Patient coming from: Home ? ?Chief Complaint:  ?Chief Complaint  ?Patient presents with  ? Jaundice  ? Back Pain  ? ?HPI: Anthony Torres is a 42 y.o. male with medical history significant of alcoholism is admitted for hyponatremia and jaundice.  Patient admits having heavy drinking in the form of hard liquor anywhere from 6-8 drinks per day for at least last 10 years has been trying to cut down.  He had a ED visit in October 2022 for right upper quadrant pain, nausea vomiting and when CT scan showed cirrhosis, hepatosplenomegaly and hepatic steatosis.  Patient noted dark urine swelling of the bilateral feet and coworkers noted him to be yellow.  He came in for further evaluation due to yellow skin color.  He reports feeling tired. ? ?While in the ED, he underwent ultrasound the right upper quadrant which showed cirrhosis.  MRI showed marked hepatomegaly with evidence of hepatic cirrhosis.  Portal hypertension and marked splenomegaly and lysis.  Small ascites and cholelithiasis seen. ?His blood work showed sodium of 126 potassium 3.3 and elevated LFTs with MCV of 109.3.  His total bilirubin is 33.7. ? ?Review of Systems: As mentioned in the history of present illness. All other systems reviewed and are negative. ?History reviewed. No pertinent past medical history. ?History reviewed. No pertinent surgical history. ?Social History:  reports that he quit smoking about 4 years ago. His smoking use included cigarettes. He has never used smokeless tobacco. He reports current alcohol use. He reports that he does not use drugs.  He has been drinking "at least 10 years.  He used to drink hard liquor but is trying to cut back ? ?Past medical history ?Alcohol abuse ? ?No Known Allergies ? ?History reviewed. No pertinent family history. ?No significant family  history ? ?Prior to Admission medications   ?Medication Sig Start Date End Date Taking? Authorizing Provider  ?ondansetron (ZOFRAN ODT) 4 MG disintegrating tablet Take 1 tablet (4 mg total) by mouth every 8 (eight) hours as needed for nausea or vomiting. ?Patient not taking: Reported on 05/26/2021 12/05/20   Shaune Pollack, MD  ?pantoprazole (PROTONIX) 40 MG tablet Take 1 tablet (40 mg total) by mouth daily for 14 days. 12/05/20 12/19/20  Shaune Pollack, MD  ? ? ?Physical Exam: ?Vitals:  ? 05/26/21 0900 05/26/21 0930 05/26/21 1030 05/26/21 1235  ?BP: 114/64 (!) 112/57 115/61 114/63  ?Pulse: 97 95 96 (!) 101  ?Resp: 18 18 17    ?Temp:      ?TempSrc:      ?SpO2: 100% 100% 100% 100%  ?Weight:      ?Height:      ? ?41 year old male lying in the bed comfortably without any acute distress ?Skin jaundiced, multiple spider hemangiomas on his neck and shoulder ?Lungs clear to auscultation bilaterally no wheezing rales rhonchi or crepitation ?Cardiovascular S1, S2 normal, no murmur rales or gallop ?Abdomen soft, minimal distention, fluid wave present.  Hepatosplenomegaly palpable.  Tenderness ?Neuro alert and oriented, nonfocal ?Extremities 1-2+ edema ? ?Data Reviewed: ? ?Elevated LFT, low sodium and potassium ? ?Assessment and Plan: ?No notes have been filed under this hospital service. ?Service: Hospitalist ? ?Severe jaundice ?Total bilirubin 33.7 ?We [facility] check hepatitis panel ?With AST: ALT pattern this is suggestive of alcoholic liver disease ?Consult GI.  Case discussed with Dr. 24 Arne see him ?  MRCP negative for any stone ?[place] right upper quadrant shows cirrhosis ? ?Severe hyponatremia/hypokalemia ?Start IV fluid and replace electrolytes ? ?Anemia ?Likely of chronic disease.  We Anthony Torres monitor ?Cannot rule out Variceal bleed ? ?Alcohol abuse ?Counseled for cessation.  He is starting to get better ?AA at discharge ? ? ? ? Advance Care Planning:   Code Status: Full Code confirmed with patient ? ?Consults: GI ? ?Family  Communication: None ? ?Severity of Illness: ?The appropriate patient status for this patient is OBSERVATION. Observation status is judged to be reasonable and necessary in order to provide the required intensity of service to ensure the patient's safety. The patient's presenting symptoms, physical exam findings, and initial radiographic and laboratory data in the context of their medical condition is felt to place them at decreased risk for further clinical deterioration. Furthermore, it is anticipated that the patient Anthony Torres be medically stable for discharge from the hospital within 2 midnights of admission.  ? ?Author: ?Delfino Lovett, MD ?05/26/2021 1:40 PM ? ?For on call review [email or web address].  ?

## 2021-05-26 NOTE — ED Notes (Signed)
Pt at CT via W/C.

## 2021-05-26 NOTE — Consult Note (Signed)
? ? Inpatient Consultation ?  ?Patient ID: Anthony Torres is a 41 y.o. male. ? ?Requesting Provider: Delfino Lovett, MD ? ?Date of Admission: 05/26/2021  ?Date of Consult: 05/26/21  ? ?Reason for Consultation: Hyperbilirubinemia  ? ?Patient's Chief Complaint:   ?Chief Complaint  ?Patient presents with  ? Jaundice  ? Back Pain  ? ? ?41 year old Caucasian male with alcohol abuse and history of retinoblastoma who presents to the hospital under the direction of his work friends for worsening yellowing of the skin.  Gastroenterology is consulted for hyperbilirubinemia. ? ?Patient noted over the last few weeks increasing yellowing of his skin and dark urine.  He says people at his workplace noted and directed him to come be evaluated.  Upon evaluation his total bilirubin is up to 33.7.  Other lab abnormalities include AST ALT elevation greater than 2:1, sodium 136 and macrocytosis of 109.  He underwent MRI MRCP to evaluate for biliary obstruction which was negative but did demonstrate cirrhotic liver and findings of portal hypertension. ? ?He has no known previous history of liver disease.  However, the patient did note for 10 years he was drinking 8 to 1012 ounce beers per day with additional 7-8 liquor shots.  He now reports he is drinking 1 mixed drink per day but approximately 3 fingers of liquor in each glass.  Symptoms have improved nausea with dry heaving.  Denies any fevers or chills.  He denies any confusion or tremor.  Has noted swelling in his lower extremities especially after standing all day.  Denies abdominal distention.  He has been able to close his pants without issue.  He has noted episodes of epistaxis and easy bruising.  Patient notes he has been flatulent with loose bowels and occasional blood with wiping which she has attributed to hemorrhoids.  He denies any Tylenol or aspirin use but does take NSAIDs a couple times a week-mostly ibuprofen 800 mg. ? ?He has had alcohol withdrawals in the past reporting  shakiness but no seizures or hallucinations. ? ?Denies Anti-plt agents, and anticoagulants ?Denies family history of gastrointestinal disease and malignancy ?Previous Endoscopies: None ? ? ?History reviewed. No pertinent past medical history. ? ?History reviewed. No pertinent surgical history. ? ?No Known Allergies ? ?History reviewed. No pertinent family history. ? ?Social History  ? ?Tobacco Use  ? Smoking status: Former  ?  Types: Cigarettes  ?  Quit date: 2019  ?  Years since quitting: 4.2  ? Smokeless tobacco: Never  ?Vaping Use  ? Vaping Use: Never used  ?Substance Use Topics  ? Alcohol use: Yes  ? Drug use: Never  ?  ? ?Pertinent GI related history and allergies were reviewed with the patient ? ?Review of Systems  ?Constitutional:  Positive for appetite change (diminished). Negative for activity change, chills, diaphoresis, fatigue, fever and unexpected weight change.  ?HENT:  Positive for nosebleeds. Negative for trouble swallowing and voice change.   ?Respiratory:  Negative for shortness of breath and wheezing.   ?Cardiovascular:  Positive for leg swelling. Negative for chest pain and palpitations.  ?Gastrointestinal:  Positive for blood in stool (occasionally with wiping) and nausea. Negative for abdominal distention, abdominal pain, anal bleeding, constipation, diarrhea and vomiting.  ?Musculoskeletal:  Negative for arthralgias and myalgias.  ?Skin:  Positive for color change. Negative for pallor.  ?Neurological:  Negative for dizziness, syncope and weakness.  ?Psychiatric/Behavioral:  Negative for confusion. The patient is not nervous/anxious.   ?All other systems reviewed and are negative.  ? ?  Medications ?Home Medications ?No current facility-administered medications on file prior to encounter.  ? ?Current Outpatient Medications on File Prior to Encounter  ?Medication Sig Dispense Refill  ? ondansetron (ZOFRAN ODT) 4 MG disintegrating tablet Take 1 tablet (4 mg total) by mouth every 8 (eight) hours as  needed for nausea or vomiting. (Patient not taking: Reported on 05/26/2021) 12 tablet 0  ? pantoprazole (PROTONIX) 40 MG tablet Take 1 tablet (40 mg total) by mouth daily for 14 days. 14 tablet 0  ? ?Pertinent GI related medications were reviewed with the patient ? ?Inpatient Medications ? ?Current Facility-Administered Medications:  ?  0.9 %  sodium chloride infusion, , Intravenous, Continuous, Delfino Lovett, MD, Last Rate: 100 mL/hr at 05/26/21 1558, New Bag at 05/26/21 1558 ?  heparin injection 5,000 Units, 5,000 Units, Subcutaneous, Q8H, Delfino Lovett, MD, 5,000 Units at 05/26/21 1556 ?  ondansetron (ZOFRAN) injection 4 mg, 4 mg, Intravenous, Once, Arnaldo Natal, MD ? sodium chloride 100 mL/hr at 05/26/21 1558  ?  ?  ? ?Objective  ? ?Vitals:  ? 05/26/21 1300 05/26/21 1330 05/26/21 1430 05/26/21 1458  ?BP: 115/70 117/67 123/78 125/73  ?Pulse: 98 100 97 79  ?Resp:   19 18  ?Temp:   98.2 ?F (36.8 ?C) (!) 97.5 ?F (36.4 ?C)  ?TempSrc:   Oral Oral  ?SpO2: 99% 100% 99% 97%  ?Weight:      ?Height:      ? ? ? ?Physical Exam ?Vitals and nursing note reviewed.  ?Constitutional:   ?   General: He is not in acute distress. ?   Appearance: He is obese. He is ill-appearing. He is not toxic-appearing or diaphoretic.  ?   Comments: Appears older than stated age  ?HENT:  ?   Head: Normocephalic and atraumatic.  ?   Nose: Nose normal.  ?   Mouth/Throat:  ?   Mouth: Mucous membranes are moist.  ?   Pharynx: Oropharynx is clear.  ?   Comments: Sublingual jaundice ?Eyes:  ?   General: Scleral icterus present.  ?   Extraocular Movements: Extraocular movements intact.  ?   Comments: R eye prosthesis;   ?Cardiovascular:  ?   Rate and Rhythm: Normal rate and regular rhythm.  ?   Heart sounds: Normal heart sounds. No murmur heard. ?  No friction rub. No gallop.  ?Pulmonary:  ?   Effort: Pulmonary effort is normal. No respiratory distress.  ?   Breath sounds: Normal breath sounds. No wheezing, rhonchi or rales.  ?Abdominal:  ?   General:  Bowel sounds are normal. There is distension.  ?   Palpations: Abdomen is soft.  ?   Tenderness: There is no abdominal tenderness. There is no guarding or rebound.  ?Musculoskeletal:  ?   Cervical back: Neck supple.  ?   Right lower leg: Edema present.  ?   Left lower leg: Edema present.  ?Skin: ?   General: Skin is warm and dry.  ?   Coloration: Skin is jaundiced. Skin is not pale.  ?   Comments: Telangiectasis and purpura about the face and body; Gynecomastia noted  ?Neurological:  ?   General: No focal deficit present.  ?   Mental Status: He is alert and oriented to person, place, and time. Mental status is at baseline.  ?   Comments: No asterixis  ?Psychiatric:     ?   Mood and Affect: Mood normal.     ?   Behavior: Behavior normal.     ?  Thought Content: Thought content normal.     ?   Judgment: Judgment normal.  ? ? ?Laboratory Data ?Recent Labs  ?Lab 05/26/21 ?0640  ?WBC 9.8  ?HGB 9.2*  ?HCT 26.9*  ?PLT 154  ? ?Recent Labs  ?Lab 05/26/21 ?0640  ?NA 126*  ?K 3.3*  ?CL 93*  ?CO2 20*  ?BUN 13  ?CALCIUM 8.4*  ?PROT 8.3*  ?BILITOT 33.7*  ?ALKPHOS 161*  ?ALT 32  ?AST 250*  ?GLUCOSE 101*  ? ?Recent Labs  ?Lab 05/26/21 ?0716  ?INR 1.5*  ? ? ?Recent Labs  ?  05/26/21 ?0640  ?LIPASE 42  ?  ? ?  ? ?Imaging Studies: ?CT ABDOMEN PELVIS WO CONTRAST ? ?Result Date: 05/26/2021 ?CLINICAL DATA:  41 year old male with neutropenia, jaundice, dark urine, possible ascites. EXAM: CT ABDOMEN AND PELVIS WITHOUT CONTRAST TECHNIQUE: Multidetector CT imaging of the abdomen and pelvis was performed following the standard protocol without IV contrast. RADIATION DOSE REDUCTION: This exam was performed according to the departmental dose-optimization program which includes automated exposure control, adjustment of the mA and/or kV according to patient size and/or use of iterative reconstruction technique. COMPARISON:  CTA Chest, Abdomen, and Pelvis 12/05/2020. FINDINGS: Lower chest: No cardiomegaly or pericardial effusion. No pleural  effusion. Negative lung bases. Hepatobiliary: Hepatomegaly and small volume perihepatic ascites with simple fluid density. No discrete liver lesion in the absence of contrast. Chronic cholelithiasis. Relativel

## 2021-05-26 NOTE — ED Notes (Signed)
US at bedside

## 2021-05-26 NOTE — ED Triage Notes (Signed)
Pt to ED ED via POV with c/o dark urine, swelling to bilateral feet, lower back pain, and per patient, "People keep telling me i look like I'm turning yellow". Pt with noted jaundice on arrival to ED. Pt states "some" umbilical abd pain from previously dx hernia. ?

## 2021-05-26 NOTE — ED Notes (Signed)
Pt notified urine sample needed when able to provide. Pt agrees to notify RN when able to give sample.  ?

## 2021-05-26 NOTE — ED Provider Notes (Signed)
? ?[facility] ?Provider Note ? ? ? Event Date/Time  ? First MD Initiated Contact with Patient 05/26/21 [phone]   ?  (approximate) ? ? ?History  ? ?Jaundice and Back Pain ? ? ?HPI ? ?Anthony Torres is a 41 y.o. male who had been a heavy drinker but has been cutting back.  He has not drank in 4 days and it is not unusual now.  He has been drinking heavily for about 2 years.  In January patient had some gallbladder problems but it went away spontaneously and he never followed up.  He comes in with mid bilateral back pain that is kind of achy.  He has had edema for about 2 weeks and gradually been turning yellow for couple weeks based on comments made by his coworkers.  He has been feeling tired lately. ? ?  ? ? ?Physical Exam  ? ?Triage Vital Signs: ?ED Triage Vitals  ?Enc Vitals Group  ?   BP 05/26/21 0638 113/73  ?   Pulse Rate 05/26/21 0638 (!) 108  ?   Resp 05/26/21 0638 16  ?   Temp 05/26/21 0638 98.7 ?F (37.1 ?C)  ?   Temp Source 05/26/21 0638 Oral  ?   SpO2 05/26/21 0638 100 %  ?   Weight 05/26/21 0639 250 lb (113.4 kg)  ?   Height 05/26/21 0639 5\' 9"  (1.753 m)  ?   Head Circumference --   ?   Peak Flow --   ?   Pain Score --   ?   Pain Loc --   ?   Pain Edu? --   ?   Excl. in [place]? --   ? ? ?Most recent vital signs: ?Vitals:  ? 05/26/21 1430 05/26/21 1458  ?BP: 123/78 125/73  ?Pulse: 97 79  ?Resp: 19 18  ?Temp: 98.2 ?F (36.8 ?C) (!) 97.5 ?F (36.4 ?C)  ?SpO2: 99% 97%  ? ? ? ?General: Awake, no distress.  [place] Mood is ?Head normocephalic atraumatic ?Eyes: Right eye is prosthetic left eye is jaundiced ?CV:  Good peripheral perfusion.  ?Resp:  Normal effort.  Lungs are clear ?Abd:  Abdomen is large patient solicits is about the same as usual.  On palpation both the liver and spleen are apparently massively enlarged.  There is no tenderness. ?Skin: He has multiple spider hemangiomas on his neck and shoulders ?Extremities: 2+ edema bilaterally ?Rectal: Small hemorrhoid palpated at the anal verge.   Some watery blood on my finger as I take my finger out his rectum.  I did not feel any other masses.  Patient reports he occasionally has blood on the toilet tissue and thought he had a hemorrhoid. ? ?ED Results / Procedures / Treatments  ? ?Labs ?(all labs ordered are listed, but only abnormal results are displayed) ?Labs Reviewed  ?CBC WITH DIFFERENTIAL/PLATELET - Abnormal; Notable for the following components:  ?    Result Value  ? RBC 2.46 (*)   ? Hemoglobin 9.2 (*)   ? HCT 26.9 (*)   ? MCV 109.3 (*)   ? MCH 37.4 (*)   ? RDW 18.3 (*)   ? Abs Immature Granulocytes 0.41 (*)   ? All other components within normal limits  ?COMPREHENSIVE METABOLIC PANEL - Abnormal; Notable for the following components:  ? Sodium 126 (*)   ? Potassium 3.3 (*)   ? Chloride 93 (*)   ? CO2 20 (*)   ? Glucose, Bld 101 (*)   ?  Calcium 8.4 (*)   ? Total Protein 8.3 (*)   ? Albumin 2.3 (*)   ? AST 250 (*)   ? Alkaline Phosphatase 161 (*)   ? Total Bilirubin 33.7 (*)   ? All other components within normal limits  ?URINALYSIS, COMPLETE (UACMP) WITH MICROSCOPIC - Abnormal; Notable for the following components:  ? Color, Urine AMBER (*)   ? APPearance HAZY (*)   ? Glucose, UA 50 (*)   ? Hgb urine dipstick MODERATE (*)   ? Bilirubin Urine MODERATE (*)   ? Protein, ur 30 (*)   ? Bacteria, UA FEW (*)   ? All other components within normal limits  ?PROTIME-INR - Abnormal; Notable for the following components:  ? Prothrombin Time 17.5 (*)   ? INR 1.5 (*)   ? All other components within normal limits  ?APTT - Abnormal; Notable for the following components:  ? aPTT 48 (*)   ? All other components within normal limits  ?LIPASE, BLOOD  ?LACTATE DEHYDROGENASE  ?HIV ANTIBODY (ROUTINE TESTING W REFLEX)  ?HEPATITIS PANEL, ACUTE  ? ? ? ?EKG ? ? ? ? ?RADIOLOGY ?CT read by radiology films reviewed by me shows chronic according to the radiologist ? ?Splenomegaly with gallstones and Vascular congestion in the mesentery.  There is some reactive lymph nodes as  well. ?Ultrasound done since the creatinine could not be interpreted and I did not use contrast on the CT shows nodular liver compatible with cirrhosis gallbladder thickening likely secondary to cirrhosis no sign of obstruction.  I reviewed t these films as well ? ?PROCEDURES: ? ?Critical Care performed:  ? ?Procedures ? ? ?MEDICATIONS ORDERED IN ED: ?Medications  ?ondansetron (ZOFRAN) injection 4 mg (0 mg Intravenous Hold 05/26/21 1052)  ?heparin injection 5,000 Units (5,000 Units Subcutaneous Given 05/26/21 1556)  ?0.9 %  sodium chloride infusion ( Intravenous New Bag/Given 05/26/21 1558)  ?gadobutrol (GADAVIST) 1 MMOL/ML injection 10 mL (10 mLs Intravenous Contrast Given 05/26/21 1214)  ? ? ? ?IMPRESSION / MDM / ASSESSMENT AND PLAN / ED COURSE  ?I reviewed the triage vital signs and the nursing notes. ? ?Discussed patient with Dr. Manuella Ghazi.  He would like an MRCP first.  He thinks the patient should be out of do contrast since his BUN is okay.  This makes sense.  I have ordered an MRCP. ? ?----------------------------------------- ?4:05 PM on 05/26/2021 ?----------------------------------------- ?MRCP has come back negative.  I discussed with Dr. Brigitte Pulse he is admitted the patient.  GI is also aware.  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 ?The patient is on the cardiac monitor to evaluate for evidence of arrhythmia and/or significant heart rate changes.  None have been seen ? ?  ? ? ?FINAL CLINICAL IMPRESSION(S) / ED DIAGNOSES  ? ?Final diagnoses:  ?Jaundice  ?Hyponatremia  ? ? ? ?Rx / DC Orders  ? ?ED Discharge Orders   ? ? None  ? ?  ? ? ? ?Note:  This document was prepared using Dragon voice recognition software and may include unintentional dictation errors. ?  ?Nena Polio, MD ?05/26/21 1607 ? ?

## 2021-05-27 DIAGNOSIS — K7010 Alcoholic hepatitis without ascites: Secondary | ICD-10-CM | POA: Diagnosis present

## 2021-05-27 DIAGNOSIS — D539 Nutritional anemia, unspecified: Secondary | ICD-10-CM | POA: Diagnosis present

## 2021-05-27 DIAGNOSIS — K7011 Alcoholic hepatitis with ascites: Secondary | ICD-10-CM

## 2021-05-27 DIAGNOSIS — E871 Hypo-osmolality and hyponatremia: Secondary | ICD-10-CM | POA: Diagnosis not present

## 2021-05-27 DIAGNOSIS — D649 Anemia, unspecified: Secondary | ICD-10-CM | POA: Diagnosis present

## 2021-05-27 DIAGNOSIS — D638 Anemia in other chronic diseases classified elsewhere: Secondary | ICD-10-CM | POA: Diagnosis present

## 2021-05-27 LAB — PROTIME-INR
INR: 1.5 — ABNORMAL HIGH (ref 0.8–1.2)
Prothrombin Time: 17.8 seconds — ABNORMAL HIGH (ref 11.4–15.2)

## 2021-05-27 LAB — COMPREHENSIVE METABOLIC PANEL
ALT: 31 U/L (ref 0–44)
AST: 206 U/L — ABNORMAL HIGH (ref 15–41)
Albumin: 2 g/dL — ABNORMAL LOW (ref 3.5–5.0)
Alkaline Phosphatase: 127 U/L — ABNORMAL HIGH (ref 38–126)
Anion gap: 11 (ref 5–15)
BUN: 17 mg/dL (ref 6–20)
CO2: 23 mmol/L (ref 22–32)
Calcium: 8.2 mg/dL — ABNORMAL LOW (ref 8.9–10.3)
Chloride: 93 mmol/L — ABNORMAL LOW (ref 98–111)
Glucose, Bld: 108 mg/dL — ABNORMAL HIGH (ref 70–99)
Potassium: 3.2 mmol/L — ABNORMAL LOW (ref 3.5–5.1)
Sodium: 127 mmol/L — ABNORMAL LOW (ref 135–145)
Total Bilirubin: 32.5 mg/dL (ref 0.3–1.2)
Total Protein: 7.5 g/dL (ref 6.5–8.1)

## 2021-05-27 LAB — CBC
HCT: 24.3 % — ABNORMAL LOW (ref 39.0–52.0)
Hemoglobin: 8.4 g/dL — ABNORMAL LOW (ref 13.0–17.0)
MCH: 37 pg — ABNORMAL HIGH (ref 26.0–34.0)
MCHC: 34.6 g/dL (ref 30.0–36.0)
MCV: 107 fL — ABNORMAL HIGH (ref 80.0–100.0)
Platelets: 147 10*3/uL — ABNORMAL LOW (ref 150–400)
RBC: 2.27 MIL/uL — ABNORMAL LOW (ref 4.22–5.81)
RDW: 17.7 % — ABNORMAL HIGH (ref 11.5–15.5)
WBC: 9.1 10*3/uL (ref 4.0–10.5)
nRBC: 0 % (ref 0.0–0.2)

## 2021-05-27 LAB — BASIC METABOLIC PANEL
Anion gap: 11 (ref 5–15)
BUN: 20 mg/dL (ref 6–20)
CO2: 22 mmol/L (ref 22–32)
Calcium: 8.2 mg/dL — ABNORMAL LOW (ref 8.9–10.3)
Chloride: 90 mmol/L — ABNORMAL LOW (ref 98–111)
Creatinine, Ser: UNDETERMINED mg/dL (ref 0.61–1.24)
Glucose, Bld: 102 mg/dL — ABNORMAL HIGH (ref 70–99)
Potassium: 3.6 mmol/L (ref 3.5–5.1)
Sodium: 123 mmol/L — ABNORMAL LOW (ref 135–145)

## 2021-05-27 LAB — APTT: aPTT: 55 seconds — ABNORMAL HIGH (ref 24–36)

## 2021-05-27 MED ORDER — IBUPROFEN 400 MG PO TABS
400.0000 mg | ORAL_TABLET | Freq: Four times a day (QID) | ORAL | Status: DC | PRN
  Administered 2021-05-27: 400 mg via ORAL
  Filled 2021-05-27: qty 1

## 2021-05-27 NOTE — Assessment & Plan Note (Signed)
Hold Lasix and spironolactone and  IV hydration for now to correct sodium and potassium ?

## 2021-05-27 NOTE — Assessment & Plan Note (Signed)
Seen by GI.  CIWA protocol.  Extensive counseling provided for alcohol cessation.  AA resources given.  GI not planning any inpatient luminal evaluation recommending outpatient follow-up.  Hepatitis panel negative.  Iron studies suggestive of acute phase reaction.  HIV negative.  Autoimmune markers pending. ?Total bilirubin coming down and jaundice improving. ?[place] Discriminant Factor 53 -GI recommending to hold off on prednisone at this time but recommending Lasix and spironolactone.  Patient received both diuretic last 24 hours but considering his low sodium and potassium I Nocholas hold off for 24 hours and give fluids and potassium replacement ? ?

## 2021-05-27 NOTE — Assessment & Plan Note (Signed)
Due to alcoholic bone marrow suppression.  No GI bleed. ?

## 2021-05-27 NOTE — TOC Initial Note (Signed)
Transition of Care (TOC) - Initial/Assessment Note  ? ? ?Patient Details  ?Name: Anthony Torres ?MRN: [id number] ?Date of Birth: 03-10-1980 ? ?Transition of Care (TOC) CM/SW Contact:    ?Anthony Chroman, LCSW ?Phone Number: ?05/27/2021, 10:25 AM ? ?Clinical Narrative: CSW met with patient. No supports at bedside. CSW introduced role and inquired about counseling resources for ETOH use. Patient is agreeable. Provided list of outpatient and inpatient options. Per MD, likely discharge tomorrow. Patient said he [place] have a ride home. No further concerns. CSW encouraged patient to contact CSW as needed. CSW Ritchie continue to follow patient for support and facilitate return home when medically stable.                ? ?Expected Discharge Plan: Home/Self Care ?Barriers to Discharge: Continued Medical Work up ? ? ?Patient Goals and CMS Choice ?  ?  ?  ? ?Expected Discharge Plan and Services ?Expected Discharge Plan: Home/Self Care ?  ?  ?Post Acute Care Choice: NA ?Living arrangements for the past 2 months: Apartment ?                ?  ?  ?  ?  ?  ?  ?  ?  ?  ?  ? ?Prior Living Arrangements/Services ?Living arrangements for the past 2 months: Apartment ?  ?Patient language and need for interpreter reviewed:: Yes ?Do you feel safe going back to the place where you live?: Yes      ?Need for Family Participation in Patient Care: Yes (Comment) ?Care giver support system in place?: Yes (comment) ?  ?Criminal Activity/Legal Involvement Pertinent to Current Situation/Hospitalization: No - Comment as needed ? ?Activities of Daily Living ?Home Assistive Devices/Equipment: None ?ADL Screening (condition at time of admission) ?Patient's cognitive ability adequate to safely complete daily activities?: Yes ?Is the patient deaf or have difficulty hearing?: No ?Does the patient have difficulty seeing, even when wearing glasses/contacts?: No ?Does the patient have difficulty concentrating, remembering, or making decisions?: No ?Patient able  to express need for assistance with ADLs?: No ?Does the patient have difficulty dressing or bathing?: No ?Independently performs ADLs?: Yes (appropriate for developmental age) ?Does the patient have difficulty walking or climbing stairs?: No ?Weakness of Legs: None ?Weakness of Arms/Hands: None ? ?Permission Sought/Granted ?  ?  ?   ?   ?   ?   ? ?Emotional Assessment ?Appearance:: Appears stated age ?Attitude/Demeanor/Rapport: Engaged, Gracious ?Affect (typically observed): Accepting, Appropriate, Calm, Pleasant ?Orientation: : Oriented to Self, Oriented to Place, Oriented to  Time, Oriented to Situation ?Alcohol / Substance Use: Alcohol Use ?Psych Involvement: No (comment) ? ?Admission diagnosis:  Hepatomegaly [R16.0] ?Hyponatremia [E87.1] ?Jaundice [R17] ?Patient Active Problem List  ? Diagnosis Date Noted  ? Hyponatremia 05/26/2021  ? ?PCP:  Pcp, No ?Pharmacy:  No Pharmacies Listed ? ? ? ?Social Determinants of Health (SDOH) Interventions ?  ? ?Readmission Risk Interventions ?   ? View : No data to display.  ?  ?  ?  ? ? ? ?

## 2021-05-27 NOTE — Progress Notes (Addendum)
? ?GI Inpatient Follow-up Note ? ?Subjective: ? ?Patient seen in follow-up for alcoholic hepatitis. No acute events overnight. Patient denies any fever, chills, nausea, vomiting, abdominal pain, diarrhea, or rectal bleeding. No new complaints today.  ? ?Scheduled Inpatient Medications:  ? folic acid  1 mg Oral Daily  ? heparin  5,000 Units Subcutaneous Q8H  ? lidocaine  1 patch Transdermal Q24H  ? multivitamin with minerals  1 tablet Oral Daily  ? ondansetron (ZOFRAN) IV  4 mg Intravenous Once  ? spironolactone  100 mg Oral Daily  ? thiamine  100 mg Oral Daily  ? Or  ? thiamine  100 mg Intravenous Daily  ? ? ?Continuous Inpatient Infusions: ?  ? sodium chloride 100 mL/hr at 05/27/21 0220  ? ? ?PRN Inpatient Medications:  ?LORazepam **OR** LORazepam ? ?Review of Systems: ?Constitutional: Weight is stable.  ?Eyes: No changes in vision. ?ENT: No oral lesions, sore throat.  ?GI: see HPI.  ?Heme/Lymph: No easy bruising.  ?CV: No chest pain.  ?GU: No hematuria.  ?Integumentary: No rashes.  ?Neuro: No headaches.  ?Psych: No depression/anxiety.  ?Endocrine: No heat/cold intolerance.  ?Allergic/Immunologic: No urticaria.  ?Resp: No cough, SOB.  ?Musculoskeletal: No joint swelling.  ?  ?Physical Examination: ?BP 121/64 (BP Location: Right Arm)   Pulse 100   Temp 98.5 ?F (36.9 ?C)   Resp 19   Ht 5\' 9"  (1.753 m)   Wt 113.4 kg   SpO2 99%   BMI 36.92 kg/m?  ?Gen: NAD, alert and oriented x 4, appears older than stated age ?HEENT: PEERLA, EOMI, +scleral icterus ?Neck: supple, no JVD or thyromegaly ?Chest: CTA bilaterally, no wheezes, crackles, or other adventitious sounds ?CV: RRR, no m/g/c/r ?Abd: soft, mildly distended, +BS in all four quadrants; no TTP in all four quadrants, no guarding, ridigity, or rebound tenderness ?Ext: 1-2+ BLEE ?Skin: Spider angiomas present on face and body, gynecomastia noted; deep jaundice ?Lymph: no LAD ? ?Data: ?Lab Results  ?Component Value Date  ? WBC 9.1 05/27/2021  ? HGB 8.4 (L)  05/27/2021  ? HCT 24.3 (L) 05/27/2021  ? MCV 107.0 (H) 05/27/2021  ? PLT 147 (L) 05/27/2021  ? ?Recent Labs  ?Lab 05/26/21 ?0640 05/27/21 ?0330  ?HGB 9.2* 8.4*  ? ?Lab Results  ?Component Value Date  ? NA 127 (L) 05/27/2021  ? K 3.2 (L) 05/27/2021  ? CL 93 (L) 05/27/2021  ? CO2 23 05/27/2021  ? BUN 17 05/27/2021  ? CREATININE NOT VALID 05/27/2021  ? ?Lab Results  ?Component Value Date  ? ALT 31 05/27/2021  ? AST 206 (H) 05/27/2021  ? ALKPHOS 127 (H) 05/27/2021  ? BILITOT 32.5 (HH) 05/27/2021  ? ?Recent Labs  ?Lab 05/27/21 ?0330  ?APTT 55*  ?INR 1.5*  ? ?MRI/MRCP 05/26/2021: ?IMPRESSION: ?1. Marked hepatomegaly with evidence of hepatic cirrhosis. ?2. Portal hypertension including marked splenomegaly, and varices. ?3. Small volume ascites. ?4. Cholelithiasis. ? ?RUQ [place] 05/26/2021: ?IMPRESSION: ?1. Nodular liver contour and coarse hepatic echotexture compatible ?with Cirrhosis, concordant with CT Abdomen and Pelvis today. Main ?portal vein remains patent with hepatopetal flow direction. No ?discrete liver lesion by ultrasound. ?  ?2. Cholelithiasis with gallbladder wall thickening likely secondary ?to Cirrhosis. No evidence of bile duct obstruction. ?  ?3. Small volume ascites. ? ?Assessment/Plan: ? ?41 y/o Caucasian male with a PMH of retinoblastoma and EtOH abuse admitted yesterday for alcoholic hepatitis. GI consulted for further evaluation and management.  ? ?Alcoholic hepatitis - Maddrey DF 53 ? 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 ?Macrocytic anemia - 2/2 alcoholic liver disease ? ?Asymptomatic cholelithiasis - no evidence of cholecystitis  ? ?Hyponatremia, hypokalemia  ? ? ?Recommendations: ? ?- Continue supportive care as per primary team ?- CIWA protocol ?- Continue to monitor daily LFTs ?- Defer starting prednisolone at this time given poor follow-up and in setting of continued EtOH abuse ?- Stressed upmost importance of complete alcohol cessation  to prevent further hepatic decompensation. He should be provided with local resources (AAA, rehab, detox, etc) ?- H&H stable with no signs of overt gastrointestinal bleeding ?- No plans for inpatient luminal evaluation. Defer to outpatient setting to set up EGD for variceal screening.  ?- Continue diuretics with Lasix 40 mg and Spironolactone 100 mg daily ?- Low-salt (<2,000 mg/day) diet ?- Follow-up on results of hepatic serologies - viral hepatitis panel negative, iron studies elevated due to acute phase reactions and alcohol use, HIV negative, autoimmune markers pending  ?- If there are signs of GI bleeding or further hepatic decompensation, please call Dr. Virgina Jock ?- GI Cesareo sign off at this time.  ? ?Please call with questions or concerns. ? ? ? ?Octavia Bruckner, PA-C ?[facility] ?[phone] (Office 3) ? ? ? ?

## 2021-05-27 NOTE — Assessment & Plan Note (Signed)
Due to alcoholic bone marrow suppression.  No GI bleed. ?

## 2021-05-27 NOTE — Progress Notes (Addendum)
?  Progress Note ? ? ?Patient: Anthony Torres [id number]:[id number] DOB: [birth year] DOA: 05/26/2021     1 ?DOS: the patient was seen and examined on 05/27/2021 ?  ?Brief hospital course: ?41 year old admitted for elevated LFTs and jaundice ? ?4/17: Bilirubin coming down ? ? ?Assessment and Plan: ?* Hyponatremia ?Hold Lasix and spironolactone and  IV hydration for now to correct sodium and potassium ? 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  No GI bleed. ? ?Alcoholic hepatitis ?Seen by GI.  CIWA protocol.  Extensive counseling provided for alcohol cessation.  AA resources given.  GI not planning any inpatient luminal evaluation recommending outpatient follow-up.  Hepatitis panel negative.  Iron studies suggestive of acute phase reaction.  HIV negative.  Autoimmune markers pending. ?Total bilirubin coming down and jaundice improving. ?Maddrey Discriminant Factor 53 -GI recommending to hold off on prednisone at this time but recommending Lasix and spironolactone.  Patient received both diuretic last 24 hours but considering his low sodium and potassium I Evelyn hold off for 24 hours and give fluids and potassium replacement ? ? ? ? ? ?  ? ?Subjective: Complains of headache and back pain ? ?Physical Exam: ?Vitals:  ? 05/26/21 1945 05/27/21 0348 05/27/21 0826 05/27/21 1504  ?BP: 119/64 128/68 121/64 127/69  ?Pulse: 97 (!) 103 100 100  ?Resp: 17 19    ?Temp: 98.4 ?F (36.9 ?C) 98.7 ?F (37.1 ?C) 98.5 ?F (36.9 ?C) 97.9 ?F (36.6 ?C)  ?TempSrc:  Oral    ?SpO2: 98% 100% 99% 100%  ?Weight:      ?Height:      ? ?41 year old male lying in the bed comfortably without any acute distress ?Skin jaundiced, multiple spider hemangiomas on his neck and shoulder ?Lungs clear to auscultation bilaterally no wheezing rales rhonchi or crepitation ?Cardiovascular S1, S2 normal, no murmur rales or gallop ?Abdomen soft, minimal distention, fluid wave present.  Hepatosplenomegaly palpable.  Tenderness ?Neuro alert and oriented,  nonfocal ?Extremities trace lower extremity edema ? ?Data Reviewed: ? ?Sodium 127, potassium 3.2, AST 206, total bili 32.5 ? ?Family Communication: None ? ?Disposition: ?Status is: Inpatient ?Remains inpatient appropriate because: Ongoing management of alcoholic hepatitis await electrolytes improvement and total bilirubin improvement ? ? Planned Discharge Destination: Home ? ? ? DVT prophylaxis/heparin subcu ?Time spent: 35 minutes ? ?Author: ?Delfino Lovett, MD ?05/27/2021 5:27 PM ? ?For on call review [email or web address].  ?

## 2021-05-27 NOTE — Hospital Course (Addendum)
41 year old admitted for elevated LFTs and jaundice secondary to alcoholic hepatitis.  Patient had already weaned himself down to 1 drink per night.  Has not shown signs of withdrawal.  Committed to avoiding alcohol going forward. ? ?4/17: Tbili down slightly 33.7>>32.5 ?4/18: Tbili up again 33.2.  GI recommended starting prednisone ?4/19: Tbili unchanged 33.2 otherwise LFT's improving ? ?

## 2021-05-28 DIAGNOSIS — D539 Nutritional anemia, unspecified: Secondary | ICD-10-CM | POA: Diagnosis not present

## 2021-05-28 DIAGNOSIS — E871 Hypo-osmolality and hyponatremia: Secondary | ICD-10-CM | POA: Diagnosis not present

## 2021-05-28 DIAGNOSIS — K7011 Alcoholic hepatitis with ascites: Secondary | ICD-10-CM | POA: Diagnosis not present

## 2021-05-28 LAB — BASIC METABOLIC PANEL
Anion gap: 13 (ref 5–15)
BUN: 25 mg/dL — ABNORMAL HIGH (ref 6–20)
CO2: 20 mmol/L — ABNORMAL LOW (ref 22–32)
Calcium: 8.2 mg/dL — ABNORMAL LOW (ref 8.9–10.3)
Chloride: 92 mmol/L — ABNORMAL LOW (ref 98–111)
Creatinine, Ser: UNDETERMINED mg/dL (ref 0.61–1.24)
Glucose, Bld: 112 mg/dL — ABNORMAL HIGH (ref 70–99)
Potassium: 3.4 mmol/L — ABNORMAL LOW (ref 3.5–5.1)
Sodium: 125 mmol/L — ABNORMAL LOW (ref 135–145)

## 2021-05-28 LAB — HEPATIC FUNCTION PANEL
ALT: 30 U/L (ref 0–44)
AST: 193 U/L — ABNORMAL HIGH (ref 15–41)
Albumin: 1.9 g/dL — ABNORMAL LOW (ref 3.5–5.0)
Alkaline Phosphatase: 148 U/L — ABNORMAL HIGH (ref 38–126)
Bilirubin, Direct: 19.7 mg/dL — ABNORMAL HIGH (ref 0.0–0.2)
Indirect Bilirubin: 13.5 mg/dL — ABNORMAL HIGH (ref 0.3–0.9)
Total Bilirubin: 33.2 mg/dL (ref 0.3–1.2)
Total Protein: 7.6 g/dL (ref 6.5–8.1)

## 2021-05-28 LAB — CBC
HCT: 23.3 % — ABNORMAL LOW (ref 39.0–52.0)
Hemoglobin: 8.2 g/dL — ABNORMAL LOW (ref 13.0–17.0)
MCH: 37.8 pg — ABNORMAL HIGH (ref 26.0–34.0)
MCHC: 35.2 g/dL (ref 30.0–36.0)
MCV: 107.4 fL — ABNORMAL HIGH (ref 80.0–100.0)
Platelets: 159 10*3/uL (ref 150–400)
RBC: 2.17 MIL/uL — ABNORMAL LOW (ref 4.22–5.81)
RDW: 17.2 % — ABNORMAL HIGH (ref 11.5–15.5)
WBC: 13.1 10*3/uL — ABNORMAL HIGH (ref 4.0–10.5)
nRBC: 0 % (ref 0.0–0.2)

## 2021-05-28 LAB — PROTIME-INR
INR: 1.4 — ABNORMAL HIGH (ref 0.8–1.2)
Prothrombin Time: 17.2 seconds — ABNORMAL HIGH (ref 11.4–15.2)

## 2021-05-28 LAB — MITOCHONDRIAL ANTIBODIES: Mitochondrial M2 Ab, IgG: <20 Units (ref 0.0–20.0)

## 2021-05-28 MED ORDER — SENNOSIDES-DOCUSATE SODIUM 8.6-50 MG PO TABS
2.0000 | ORAL_TABLET | Freq: Two times a day (BID) | ORAL | Status: DC
  Administered 2021-05-29 – 2021-05-30 (×4): 2 via ORAL
  Filled 2021-05-28 (×7): qty 2

## 2021-05-28 MED ORDER — POLYETHYLENE GLYCOL 3350 17 G PO PACK
17.0000 g | PACK | Freq: Every day | ORAL | Status: DC
  Filled 2021-05-28 (×4): qty 1

## 2021-05-28 MED ORDER — SPIRONOLACTONE 25 MG PO TABS
100.0000 mg | ORAL_TABLET | Freq: Every day | ORAL | Status: DC
  Administered 2021-05-28: 100 mg via ORAL
  Filled 2021-05-28: qty 4

## 2021-05-28 MED ORDER — PREDNISONE 20 MG PO TABS
40.0000 mg | ORAL_TABLET | Freq: Every day | ORAL | Status: DC
  Administered 2021-05-28 – 2021-05-31 (×4): 40 mg via ORAL
  Filled 2021-05-28 (×4): qty 2

## 2021-05-28 MED ORDER — FUROSEMIDE 10 MG/ML IJ SOLN
40.0000 mg | Freq: Once | INTRAMUSCULAR | Status: AC
  Administered 2021-05-28: 40 mg via INTRAVENOUS
  Filled 2021-05-28: qty 4

## 2021-05-28 MED ORDER — SODIUM CHLORIDE 0.9 % IV SOLN
INTRAVENOUS | Status: DC
Start: 2021-05-28 — End: 2021-05-29

## 2021-05-28 MED ORDER — POTASSIUM CHLORIDE CRYS ER 20 MEQ PO TBCR
40.0000 meq | EXTENDED_RELEASE_TABLET | Freq: Once | ORAL | Status: AC
  Administered 2021-05-28: 40 meq via ORAL
  Filled 2021-05-28: qty 2

## 2021-05-28 NOTE — Assessment & Plan Note (Signed)
Seen by GI.  Continue CIWA protocol.  Extensive counseling provided for alcohol cessation.  AA resources given.  GI not planning any inpatient luminal evaluation recommending outpatient follow-up.  Hepatitis panel negative.  Iron studies suggestive of acute phase reaction.  HIV negative.  Autoimmune markers pending. ?Total bilirubin fluctuating.  Yesterday they were down but again it went up today.  I discussed with Dr. Timothy Lasso who recommended to start him on steroids.  I have ordered prednisone 40 mg p.o. daily for 7 days.  If improvement in Maddrey discriminant factor and Lille score on day 7 he Tudor need to continue steroids for total 28 days followed by taper (if stops alcohol). ?Maddrey Discriminant Factor 53 on admission ?His total bilirubin 33.2-> 32.5-> 33.2 ? ? ? ?

## 2021-05-28 NOTE — Assessment & Plan Note (Addendum)
Hold Lasix and spironolactone and  IV hydration for now to correct sodium and potassium ?Sodium 125, potassium 3.4 ?GI would like to start him on Lasix and spironolactone once electrolyte improves if possible ?

## 2021-05-28 NOTE — Care Plan (Signed)
Brief GI Note ? ?Discussed case with hospitalist ?Bilirubin fluctuating, which is expected given etiology ?Can start prednisone or prednisolone 40 mg daily for the next 7 days. If improvement in Maddrey Discriminant Factor and Lille score on day 7, continue steroids for total of 28 days followed by taper (if discontinues drinking etoh) ?Pt Anthony Torres need lab check on day 7 (for Maddrey and Lille scoring) as anticipate Anthony Torres not be in the hospital at that point- pt does not have PCP and [name] need to establish care ?Ascites is common in alcoholic hepatitis outside of cirrhosis, not enough fluid to tap at this time ?Diuretics currently on hold for continued electrolyte abnormalities ? ?GI available as needed. ? ?[name], DO ?[facility] ?

## 2021-05-28 NOTE — Progress Notes (Signed)
?  Progress Note ? ? ?Patient: Anthony Torres [id number]:[id number] DOB: 1980-07-12 DOA: 05/26/2021     2 ?DOS: the patient was seen and examined on 05/28/2021 ?  ?Brief hospital course: ?41 year old admitted for elevated LFTs and jaundice ? ?4/17: Bilirubin coming down ?4/18: Bilirubin up again.  GI recommending starting prednisone ? ? ? ?Assessment and Plan: ?* Hyponatremia ?Hold Lasix and spironolactone and  IV hydration for now to correct sodium and potassium ?Sodium 125, potassium 3.4 ?GI would like to start him on Lasix and spironolactone once electrolyte improves if possible ? 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  No GI bleed. ? ?Alcoholic hepatitis ?Seen by GI.  Continue CIWA protocol.  Extensive counseling provided for alcohol cessation.  AA resources given.  GI not planning any inpatient luminal evaluation recommending outpatient follow-up.  Hepatitis panel negative.  Iron studies suggestive of acute phase reaction.  HIV negative.  Autoimmune markers pending. ?Total bilirubin fluctuating.  Yesterday they were down but again it went up today.  I discussed with Dr. Timothy Lasso who recommended to start him on steroids.  I have ordered prednisone 40 mg p.o. daily for 7 days.  If improvement in Maddrey discriminant factor and Lille score on day 7 he Sara need to continue steroids for total 28 days followed by taper (if stops alcohol). ?Maddrey Discriminant Factor 53 on admission ?His total bilirubin 33.2-> 32.5-> 33.2 ? ? ? ? ? ? ? ?  ? ?Subjective: Denies any new complaints. ? ?Physical Exam: ?Vitals:  ? 05/28/21 0556 05/28/21 0842 05/28/21 1543 05/28/21 2009  ?BP: 124/63 121/65 117/68 128/72  ?Pulse: 100 (!) 102 97 92  ?Resp: 17 16 16 18   ?Temp: 99 ?F (37.2 ?C) 98.8 ?F (37.1 ?C) 99 ?F (37.2 ?C) 98 ?F (36.7 ?C)  ?TempSrc: Oral Oral Oral   ?SpO2: 99% 99% 99% 100%  ?Weight:      ?Height:      ? ?41 year old male lying in the bed comfortably without any acute distress ?Skin jaundiced, multiple spider  hemangiomas on his neck and shoulder ?Lungs clear to auscultation bilaterally no wheezing rales rhonchi or crepitation ?Cardiovascular S1, S2 normal, no murmur rales or gallop ?Abdomen soft, minimal distention, fluid wave present.  Hepatosplenomegaly palpable.  Tenderness ?Neuro alert and oriented, nonfocal ?Extremities 1+ edema ? ?Data Reviewed: ? ?Sodium 125, potassium 3.4, total bilirubin 33.2 ? ?Family Communication: None ? ?Disposition: ?Status is: Inpatient ?Remains inpatient appropriate because: Management of alcoholic hepatitis and jaundice ? ? Planned Discharge Destination: Home ? ? ? DVT prophylaxis-SCDs and subcu heparin ?Time spent: 35 minutes ? ?Author: ?24, MD ?05/28/2021 9:18 PM ? ?For on call review [email or web address].  ?

## 2021-05-29 DIAGNOSIS — D72829 Elevated white blood cell count, unspecified: Secondary | ICD-10-CM | POA: Diagnosis not present

## 2021-05-29 DIAGNOSIS — E876 Hypokalemia: Secondary | ICD-10-CM | POA: Diagnosis present

## 2021-05-29 DIAGNOSIS — E538 Deficiency of other specified B group vitamins: Secondary | ICD-10-CM | POA: Diagnosis present

## 2021-05-29 DIAGNOSIS — E871 Hypo-osmolality and hyponatremia: Secondary | ICD-10-CM | POA: Diagnosis not present

## 2021-05-29 DIAGNOSIS — E8809 Other disorders of plasma-protein metabolism, not elsewhere classified: Secondary | ICD-10-CM

## 2021-05-29 DIAGNOSIS — K7011 Alcoholic hepatitis with ascites: Secondary | ICD-10-CM | POA: Diagnosis not present

## 2021-05-29 LAB — SODIUM: Sodium: 127 mmol/L — ABNORMAL LOW (ref 135–145)

## 2021-05-29 LAB — CBC
HCT: 22.4 % — ABNORMAL LOW (ref 39.0–52.0)
Hemoglobin: 7.7 g/dL — ABNORMAL LOW (ref 13.0–17.0)
MCH: 37 pg — ABNORMAL HIGH (ref 26.0–34.0)
MCHC: 34.4 g/dL (ref 30.0–36.0)
MCV: 107.7 fL — ABNORMAL HIGH (ref 80.0–100.0)
Platelets: 167 10*3/uL (ref 150–400)
RBC: 2.08 MIL/uL — ABNORMAL LOW (ref 4.22–5.81)
RDW: 16.7 % — ABNORMAL HIGH (ref 11.5–15.5)
WBC: 12.9 10*3/uL — ABNORMAL HIGH (ref 4.0–10.5)
nRBC: 0 % (ref 0.0–0.2)

## 2021-05-29 LAB — COMPREHENSIVE METABOLIC PANEL
ALT: 30 U/L (ref 0–44)
AST: 145 U/L — ABNORMAL HIGH (ref 15–41)
Albumin: 1.9 g/dL — ABNORMAL LOW (ref 3.5–5.0)
Alkaline Phosphatase: 145 U/L — ABNORMAL HIGH (ref 38–126)
Anion gap: 9 (ref 5–15)
BUN: 34 mg/dL — ABNORMAL HIGH (ref 6–20)
CO2: 20 mmol/L — ABNORMAL LOW (ref 22–32)
Calcium: 8 mg/dL — ABNORMAL LOW (ref 8.9–10.3)
Chloride: 96 mmol/L — ABNORMAL LOW (ref 98–111)
Creatinine, Ser: UNDETERMINED mg/dL (ref 0.61–1.24)
Glucose, Bld: 111 mg/dL — ABNORMAL HIGH (ref 70–99)
Potassium: 3.9 mmol/L (ref 3.5–5.1)
Sodium: 125 mmol/L — ABNORMAL LOW (ref 135–145)
Total Bilirubin: 33.2 mg/dL (ref 0.3–1.2)
Total Protein: 7.4 g/dL (ref 6.5–8.1)

## 2021-05-29 MED ORDER — PANTOPRAZOLE SODIUM 40 MG PO TBEC
40.0000 mg | DELAYED_RELEASE_TABLET | Freq: Every day | ORAL | Status: DC
  Administered 2021-05-29 – 2021-05-31 (×3): 40 mg via ORAL
  Filled 2021-05-29 (×3): qty 1

## 2021-05-29 MED ORDER — SPIRONOLACTONE 25 MG PO TABS
25.0000 mg | ORAL_TABLET | Freq: Every day | ORAL | Status: DC
  Administered 2021-05-29 – 2021-05-30 (×2): 25 mg via ORAL
  Filled 2021-05-29 (×2): qty 1

## 2021-05-29 MED ORDER — CALCIUM CARBONATE ANTACID 500 MG PO CHEW
1.0000 | CHEWABLE_TABLET | Freq: Three times a day (TID) | ORAL | Status: DC | PRN
  Administered 2021-05-29: 200 mg via ORAL
  Filled 2021-05-29: qty 1

## 2021-05-29 MED ORDER — FUROSEMIDE 10 MG/ML IJ SOLN
40.0000 mg | Freq: Once | INTRAMUSCULAR | Status: AC
Start: 2021-05-29 — End: 2021-05-29
  Administered 2021-05-29: 40 mg via INTRAVENOUS
  Filled 2021-05-29: qty 4

## 2021-05-29 MED ORDER — ALBUMIN HUMAN 5 % IV SOLN
12.5000 g | Freq: Once | INTRAVENOUS | Status: AC
  Administered 2021-05-29: 12.5 g via INTRAVENOUS
  Filled 2021-05-29 (×2): qty 250

## 2021-05-29 NOTE — Assessment & Plan Note (Addendum)
Resolved with replacement.  K today 3.9.   ?Monitor and replace K as needed. ?

## 2021-05-29 NOTE — Assessment & Plan Note (Signed)
Continue folic acid supplement ?

## 2021-05-29 NOTE — Assessment & Plan Note (Addendum)
Due to alcoholic bone marrow suppression.  No GI bleeding.  Folic acid deficient, started on supplement. ?Hbg stable. ?Held VTE chemoprophylaxis.  SCD's ?

## 2021-05-29 NOTE — Assessment & Plan Note (Addendum)
T. bili trend: 33.7 >> 32.5 >> 33.2 >> 33.2 >> 31.7 ?Due to alcoholic hepatitis.   ?Continue prednisone. ?Monitor hepatic function. ?

## 2021-05-29 NOTE — Progress Notes (Signed)
?Progress Note ? ? ?Patient: Anthony Torres [id number]:[id number] DOB: [birth year] DOA: 05/26/2021     3 ?DOS: the patient was seen and examined on 05/29/2021 ?  ?Brief hospital course: ?41 year old admitted for elevated LFTs and jaundice secondary to alcoholic hepatitis.  Patient had already weaned himself down to 1 drink per night.  Has not shown signs of withdrawal.  Committed to avoiding alcohol going forward. ? ?4/17: Tbili down slightly 33.7>>32.5 ?4/18: Tbili up again 33.2.  GI recommended starting prednisone ?4/19: Tbili unchanged 33.2 otherwise LFT's improving ? ? ?Assessment and Plan: ?* Alcoholic hepatitis ?Maddrey Discriminant Factor 53 on admission.  Seen by GI.   ?GI not planning any inpatient luminal evaluation recommending outpatient follow-up.  ? ?Counseling provided for alcohol cessation, AA resources given.  ?No signs of withdrawal as pt had already self-tapered off alcohol prior to admission was having 1 drink each night.  ?  ?Hepatitis panel negative.   ?Iron studies suggestive of acute phase reaction.   ?HIV negative.   ?Autoimmune markers pending. ? ?GI recommended to start on steroids.  ?--Continue prednisone 40 mg PO daily x 7 days.  If improvement in Maddrey discriminant factor and Lille score on day 7, need to continue steroids for total 28 days followed by taper (if stops alcohol). ? ? ? ? ?Hyperbilirubinemia ?T. bili trend: 33.7 >> 32.5 >> 33.2 >> 33.2 ?Due to alcoholic hepatitis.  Continue prednisone and monitor hepatic function. ? ?Hyponatremia ?Na trend: 126 >> 127 >> 123 >> 125 >> 125. ? ?Felt due to volume depletion.  Started on IV fluids yesterday, sodium level unchanged.  Patient has significant third spacing edema. ? ?-- Stop IV fluids ?-- 40 mg IV Lasix once preceded by IV albumin  ?-- Resume spironolactone 25 mg daily ?-- Monitor BMP daily ?-- Titrate up diuretics as electrolytes, renal function, and blood pressure tolerate ? ?Hypokalemia ?Resolved with replacement.  K today 3.9.  Monitor  and replace K as needed. ? ?Hypoalbuminemia ?Secondary to liver disease.  Patient with significant peripheral edema due to third spacing.  Stopping IV fluids today and Anthony Torres diuresis after IV albumin. ? ?Leukocytosis ?Due to steroids. ?No evidence of infection. ?Monitor CBC. ? 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  No GI bleeding. ?Folic acid deficient, started on supplement. ? ?Folic acid deficiency ?Continue folic acid supplement ? ? ? ? ?  ? ?Subjective: Patient awake sitting up in bed when seen today.  He reports overall feeling fairly well.  Reports he had weaned himself down to only 1 drink per night after he attempted to quit cold [place] and developed tremors and nausea vomiting.  History of being a heavy drinker with 10+ drinks each night.  Reports he is committed to alcohol cessation.  He denies having any significant withdrawal symptoms since admission.  Denies abdominal pain or nausea vomiting.  Feels like he is making less urine. ? ?Physical Exam: ?Vitals:  ? 05/28/21 1543 05/28/21 2009 05/29/21 0436 05/29/21 0807  ?BP: 117/68 128/72 122/65 118/67  ?Pulse: 97 92 99 94  ?Resp: _0 ?Temp: 99 ?F (37.2 ?C) 98 ?F (36.7 ?C) 98.2 ?F (36.8 ?C) 98.7 ?F (37.1 ?C)  ?TempSrc: Oral   Oral  ?SpO2: 99% 100% 99% 100%  ?Weight:      ?Height:      ? ?General exam: awake, alert, no acute distress ?HEENT: Icteric sclera, moist mucus membranes, hearing grossly normal  ?Respiratory system: CTAB, no wheezes, rales or rhonchi, normal respiratory  effort. ?Cardiovascular system: normal S1/S2, RRR, 3+ pitting lower extremity and pedal edema.   ?Gastrointestinal system: Mildly distended, nontender on palpation. ?Central nervous system: A&O x3. no gross focal neurologic deficits, normal speech ?Extremities: moves all, no edema, normal tone ?Skin: Jaundice, dry, intact, normal temperature ?Psychiatry: normal mood, congruent affect, judgement and insight appear normal ? ? ? ?Data Reviewed: ? ?Notable  labs: Sodium 125 unchanged, chloride 96, CO2 20, glucose 111, BUN 34, calcium 8.0, alk phos 145, albumin 1.9, AST improved 145.  Total bili 33.2 unchanged.  CBC with white count 12.9, hemoglobin 7.7 ? ?Family Communication: None at bedside on rounds.  Anthony Torres attempt to call as time allows ? ?Disposition: ?Status is: Inpatient ?Remains inpatient appropriate because: Persistent electrolyte derangements, on IV therapy as outlined above ? ? Planned Discharge Destination: Home ? ? ? ?Time spent: 35 minutes ? ?Author: ?Ezekiel Slocumb, DO ?05/29/2021 2:19 PM ? ?For on call review [email or web address].  ?

## 2021-05-29 NOTE — Assessment & Plan Note (Addendum)
Maddrey Discriminant Factor 53 on admission.  Seen by GI.   ?GI not planning any inpatient luminal evaluation recommending outpatient follow-up.  ? ?Counseling provided for alcohol cessation, AA resources given.  ?No signs of withdrawal as pt had already self-tapered off alcohol prior to admission was having 1 drink each night.  ?  ?Hepatitis panel negative.   ?Iron studies suggestive of acute phase reaction.   ?HIV negative.   ?Autoimmune markers pending. ? ?GI recommended to start on steroids.  ?--Continue prednisone 40 mg PO daily x 7 days.  If improvement in Maddrey discriminant factor and Lille score on day 7, need to continue steroids for total 28 days followed by taper (if stops alcohol). ? ?Patient to have repeat labs drawn at [facility] walkin clinic and follow up on results with GI. ? ? ?

## 2021-05-29 NOTE — Assessment & Plan Note (Addendum)
Na trend: 126 >> 127 >> 123 >> 125 >> 125 >> 127>>127>>131. ? ?Felt due to volume depletion.  Started on IV fluids yesterday, sodium level unchanged.  Patient with significant third spacing edema. ? ?-- Stopped IV fluids (4/19) ?-- Treated with 40 mg IV Lasix x1 after IV albumin (4/19, 4/20) ?-- Started on Lasix 40 mg, Aldactone 100 mg daily ?--Strict I/O's and daily weights ?-- Monitor BMP in follow up ? ?

## 2021-05-29 NOTE — Progress Notes (Signed)
Mobility Specialist - Progress Note ? ? 05/29/21 1300  ?Mobility  ?Activity Ambulated independently in hallway;Stood at bedside;Dangled on edge of bed  ?Level of Assistance Independent  ?Assistive Device Other (Comment) ?(IV Pole)  ?Distance Ambulated (ft) 160 ft  ?Activity Response Tolerated well  ?$Mobility charge 1 Mobility  ? ? ? ?Pre-mobility: 104 HR,  93% SpO2 ?During mobility: 110 HR, 96% SpO2 ? ?Pt sitting in bed upon arrival using RA. Pt completes bed mobility and STS indep. Completes gaits indep vocing no complaints. Spontaneously returns to room after 1 lap around nursing station. Pt returns to room with needs in reach. ? ?Clarisa Schools ?Mobility Specialist ?05/29/21, 1:12 PM ? ? ? ? ?

## 2021-05-29 NOTE — Assessment & Plan Note (Addendum)
Secondary to liver disease.  Patient with significant peripheral edema due to third spacing.  Stopped IV fluids yesterday. ?Given IV Lasix after IV albumin 4/19, 20. ? ?

## 2021-05-29 NOTE — Plan of Care (Signed)
  Problem: Education: Goal: Knowledge of General Education information Anthony Torres improve Description: Including pain rating scale, medication(s)/side effects and non-pharmacologic comfort measures Outcome: Progressing   Problem: Health Behavior/Discharge Planning: Goal: Ability to manage health-related needs Anthony Torres improve Outcome: Progressing   Problem: Clinical Measurements: Goal: Ability to maintain clinical measurements within normal limits Anthony Torres improve Outcome: Progressing Goal: Anthony Torres remain free from infection Outcome: Progressing Goal: Diagnostic test results Anthony Torres improve Outcome: Progressing Goal: Respiratory complications Anthony Torres improve Outcome: Progressing Goal: Cardiovascular complication Anthony Torres be avoided Outcome: Progressing   

## 2021-05-29 NOTE — Assessment & Plan Note (Signed)
Due to steroids. ?No evidence of infection. ?Monitor CBC. ?

## 2021-05-30 DIAGNOSIS — E8809 Other disorders of plasma-protein metabolism, not elsewhere classified: Secondary | ICD-10-CM | POA: Diagnosis not present

## 2021-05-30 DIAGNOSIS — D539 Nutritional anemia, unspecified: Secondary | ICD-10-CM | POA: Diagnosis not present

## 2021-05-30 DIAGNOSIS — E871 Hypo-osmolality and hyponatremia: Secondary | ICD-10-CM | POA: Diagnosis not present

## 2021-05-30 DIAGNOSIS — K7011 Alcoholic hepatitis with ascites: Secondary | ICD-10-CM | POA: Diagnosis not present

## 2021-05-30 LAB — CBC
HCT: 20.6 % — ABNORMAL LOW (ref 39.0–52.0)
Hemoglobin: 7.2 g/dL — ABNORMAL LOW (ref 13.0–17.0)
MCH: 37.5 pg — ABNORMAL HIGH (ref 26.0–34.0)
MCHC: 35 g/dL (ref 30.0–36.0)
MCV: 107.3 fL — ABNORMAL HIGH (ref 80.0–100.0)
Platelets: 186 10*3/uL (ref 150–400)
RBC: 1.92 MIL/uL — ABNORMAL LOW (ref 4.22–5.81)
RDW: 16.6 % — ABNORMAL HIGH (ref 11.5–15.5)
WBC: 12.2 10*3/uL — ABNORMAL HIGH (ref 4.0–10.5)
nRBC: 0 % (ref 0.0–0.2)

## 2021-05-30 LAB — COMPREHENSIVE METABOLIC PANEL
ALT: 31 U/L (ref 0–44)
AST: 129 U/L — ABNORMAL HIGH (ref 15–41)
Albumin: 2 g/dL — ABNORMAL LOW (ref 3.5–5.0)
Alkaline Phosphatase: 143 U/L — ABNORMAL HIGH (ref 38–126)
Anion gap: 9 (ref 5–15)
BUN: 43 mg/dL — ABNORMAL HIGH (ref 6–20)
CO2: 19 mmol/L — ABNORMAL LOW (ref 22–32)
Calcium: 8.2 mg/dL — ABNORMAL LOW (ref 8.9–10.3)
Chloride: 99 mmol/L (ref 98–111)
Creatinine, Ser: UNDETERMINED mg/dL (ref 0.61–1.24)
Glucose, Bld: 110 mg/dL — ABNORMAL HIGH (ref 70–99)
Potassium: 4 mmol/L (ref 3.5–5.1)
Sodium: 127 mmol/L — ABNORMAL LOW (ref 135–145)
Total Bilirubin: 31.7 mg/dL (ref 0.3–1.2)
Total Protein: 7.3 g/dL (ref 6.5–8.1)

## 2021-05-30 LAB — HEMOGLOBIN AND HEMATOCRIT, BLOOD
HCT: 25.2 % — ABNORMAL LOW (ref 39.0–52.0)
Hemoglobin: 8.7 g/dL — ABNORMAL LOW (ref 13.0–17.0)

## 2021-05-30 LAB — MAGNESIUM: Magnesium: 2.4 mg/dL (ref 1.7–2.4)

## 2021-05-30 MED ORDER — FUROSEMIDE 10 MG/ML IJ SOLN
40.0000 mg | Freq: Once | INTRAMUSCULAR | Status: AC
  Administered 2021-05-30: 40 mg via INTRAVENOUS
  Filled 2021-05-30: qty 4

## 2021-05-30 MED ORDER — SPIRONOLACTONE 25 MG PO TABS
50.0000 mg | ORAL_TABLET | Freq: Every day | ORAL | Status: DC
  Administered 2021-05-31: 50 mg via ORAL
  Filled 2021-05-30: qty 2

## 2021-05-30 MED ORDER — FUROSEMIDE 40 MG PO TABS
40.0000 mg | ORAL_TABLET | Freq: Two times a day (BID) | ORAL | Status: DC
  Administered 2021-05-30 – 2021-05-31 (×2): 40 mg via ORAL
  Filled 2021-05-30 (×2): qty 1

## 2021-05-30 MED ORDER — ALBUMIN HUMAN 5 % IV SOLN
12.5000 g | Freq: Once | INTRAVENOUS | Status: AC
Start: 2021-05-30 — End: 2021-05-30
  Administered 2021-05-30: 12.5 g via INTRAVENOUS
  Filled 2021-05-30: qty 250

## 2021-05-30 NOTE — Progress Notes (Signed)
?Progress Note ? ? ?Patient: Anthony Torres UVO:[id number] DOB: Jul 09, 1980 DOA: 05/26/2021     4 ?DOS: the patient was seen and examined on 05/30/2021 ?  ?Brief hospital course: ?41 year old admitted for elevated LFTs and jaundice secondary to alcoholic hepatitis.  Patient had already weaned himself down to 1 drink per night.  Has not shown signs of withdrawal.  Committed to avoiding alcohol going forward. ? ?4/17: Tbili down slightly 33.7>>32.5 ?4/18: Tbili up again 33.2.  GI recommended starting prednisone ?4/19: Tbili unchanged 33.2 otherwise LFT's improving ? ? ?Assessment and Plan: ?* Alcoholic hepatitis ?Maddrey Discriminant Factor 53 on admission.  Seen by GI.   ?GI not planning any inpatient luminal evaluation recommending outpatient follow-up.  ? ?Counseling provided for alcohol cessation, AA resources given.  ?No signs of withdrawal as pt had already self-tapered off alcohol prior to admission was having 1 drink each night.  ?  ?Hepatitis panel negative.   ?Iron studies suggestive of acute phase reaction.   ?HIV negative.   ?Autoimmune markers pending. ? ?GI recommended to start on steroids.  ?--Continue prednisone 40 mg PO daily x 7 days.  If improvement in Maddrey discriminant factor and Lille score on day 7, need to continue steroids for total 28 days followed by taper (if stops alcohol). ? ? ? ? ?Hyperbilirubinemia ?T. bili trend: 33.7 >> 32.5 >> 33.2 >> 33.2 >> 31.7 ?Due to alcoholic hepatitis.   ?Continue prednisone. ?Monitor hepatic function. ? ?Hyponatremia ?Na trend: 126 >> 127 >> 123 >> 125 >> 125 >> 127>>127. ? ?Felt due to volume depletion.  Started on IV fluids yesterday, sodium level unchanged.  Patient has significant third spacing edema. ? ?-- Stop IV fluids (4/19) ?-- Given 40 mg IV Lasix x1 after IV albumin (4/19) ?-- Repeat above again today ?-- Then start Lasix 40 mg BID ?-- Increase spironolactone 25 >> 50 mg daily ?-- Monitor BMP daily ?-- Titrate up diuretics as electrolytes, renal  function, and blood pressure tolerate ? ?Hypokalemia ?Resolved with replacement.  K today 3.9.  Monitor and replace K as needed. ? ?Hypoalbuminemia ?Secondary to liver disease.  Patient with significant peripheral edema due to third spacing.  Stopped IV fluids yesterday. ?Given IV Lasix after IV albumin. ?--Repeat IV Lasix and albumin today ? ?Leukocytosis ?Due to steroids. ?No evidence of infection. ?Monitor CBC. ? 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  No GI bleeding. ?Folic acid deficient, started on supplement. ? ?Hbg down to 7.2 this AM. ?Repeat later today 8.5. ? ?Hold VTE chemoprophylaxis for now. ?SCD's ? ?Folic acid deficiency ?Continue folic acid supplement ? ? ? ? ?  ? ?Subjective: Patient sleeping very soundly when seen on rounds morning.  He woke just briefly, reported fatigue but no other acute complaints.  Feet and legs remain very swollen.  Output appears picked up with diuresis yesterday. ? ?Physical Exam: ?Vitals:  ? 05/29/21 2059 05/30/21 0447 05/30/21 0500 05/30/21 0928  ?BP: 115/74 122/73  116/62  ?Pulse: 85 91  90  ?Resp: _0 ?Temp: 98.1 ?F (36.7 ?C) 98.2 ?F (36.8 ?C)  98 ?F (36.7 ?C)  ?TempSrc:    Oral  ?SpO2: 99% 98%  99%  ?Weight:   117.1 kg   ?Height:      ? ?General exam: Sleeping soundly, snoring, no acute distress ?HEENT: moist mucus membranes, hearing grossly normal  ?Respiratory system: CTAB on exam limited by snoring, normal respiratory effort, on room air. ?Cardiovascular system: normal S1/S2, RRR, 3+ pitting  lower extremity and pedal edema.   ?Gastrointestinal system: Distended, nontender. ?Central nervous system: no gross focal neurologic deficits, exam limited by patient's somnolent ?Extremities: Persistent pitting lower extremity and pedal edema, normal tone ?Skin: Severe jaundice, dry intact, normal temp ? ? ? ?Data Reviewed: ? ?Weight since admission up nearly 4 kg with extensive edema ?Urine output second part of yesterday's shift 1400  cc ? ?Notable labs: Sodium improved to 127 from 125.  CO2 19.  Glucose 110.  BUN 43.  Calcium 8.2.  Alk phos 143.  Albumin 2.0, AST 129 improving, total bili 31.7 improved.  CBC with white count 12.2, hemoglobin 7.2 with afternoon repeat hemoglobin 8.7 ? ?Family Communication: None at bedside on rounds, [place] attempt to call as time allows this afternoon ? ?Disposition: ?Status is: Inpatient ?Remains inpatient appropriate because: Persistent electrolyte derangements on IV therapies as outlined above ? ? Planned Discharge Destination: Home ? ? ? ?Time spent: 35 minutes ? ?Author: ?Ezekiel Slocumb, DO ?05/30/2021 3:09 PM ? ?For on call review [email or web address].  ?

## 2021-05-30 NOTE — Plan of Care (Signed)
  Problem: Education: Goal: Knowledge of General Education information Infant improve Description: Including pain rating scale, medication(s)/side effects and non-pharmacologic comfort measures Outcome: Progressing   Problem: Health Behavior/Discharge Planning: Goal: Ability to manage health-related needs Forbes improve Outcome: Progressing   Problem: Clinical Measurements: Goal: Ability to maintain clinical measurements within normal limits Anothony improve Outcome: Progressing Goal: Dvid remain free from infection Outcome: Progressing Goal: Diagnostic test results Mcgregor improve Outcome: Progressing Goal: Respiratory complications Juris improve Outcome: Progressing Goal: Cardiovascular complication Kasten be avoided Outcome: Progressing   

## 2021-05-30 NOTE — Progress Notes (Signed)
Mobility Specialist - Progress Note ? ? ? 05/30/21 1400  ?Mobility  ?Activity Ambulated independently in hallway;Stood at bedside;Dangled on edge of bed  ?Level of Assistance Independent  ?Assistive Device None  ?Distance Ambulated (ft) 160 ft  ?Activity Response Tolerated well  ?$Mobility charge 1 Mobility  ? ? ?Pre-mobility: 99 HR,98% SpO2 ?During mobility:101  HR, 98% SpO2 ? ?Pt in bathroom for urinary output using RA upon arrival with family present. Pt ambulates 140ft indep -- voices no complaints, but mild SOB noted. Pt returns EOB with needs in reach. ? ?Anthony Torres ?Mobility Specialist ?05/30/21, 2:42 PM ? ? ? ? ?

## 2021-05-31 LAB — COMPREHENSIVE METABOLIC PANEL
ALT: 37 U/L (ref 0–44)
AST: 196 U/L — ABNORMAL HIGH (ref 15–41)
Albumin: 2.2 g/dL — ABNORMAL LOW (ref 3.5–5.0)
Alkaline Phosphatase: 164 U/L — ABNORMAL HIGH (ref 38–126)
Anion gap: 12 (ref 5–15)
BUN: 48 mg/dL — ABNORMAL HIGH (ref 6–20)
CO2: 21 mmol/L — ABNORMAL LOW (ref 22–32)
Calcium: 8.6 mg/dL — ABNORMAL LOW (ref 8.9–10.3)
Chloride: 98 mmol/L (ref 98–111)
Creatinine, Ser: UNDETERMINED mg/dL (ref 0.61–1.24)
Glucose, Bld: 121 mg/dL — ABNORMAL HIGH (ref 70–99)
Potassium: 3.8 mmol/L (ref 3.5–5.1)
Sodium: 131 mmol/L — ABNORMAL LOW (ref 135–145)
Total Bilirubin: 33.2 mg/dL (ref 0.3–1.2)
Total Protein: 7.7 g/dL (ref 6.5–8.1)

## 2021-05-31 LAB — CBC
HCT: 21.6 % — ABNORMAL LOW (ref 39.0–52.0)
Hemoglobin: 7.4 g/dL — ABNORMAL LOW (ref 13.0–17.0)
MCH: 36.6 pg — ABNORMAL HIGH (ref 26.0–34.0)
MCHC: 34.3 g/dL (ref 30.0–36.0)
MCV: 106.9 fL — ABNORMAL HIGH (ref 80.0–100.0)
Platelets: 223 10*3/uL (ref 150–400)
RBC: 2.02 MIL/uL — ABNORMAL LOW (ref 4.22–5.81)
RDW: 16.7 % — ABNORMAL HIGH (ref 11.5–15.5)
WBC: 12.1 10*3/uL — ABNORMAL HIGH (ref 4.0–10.5)
nRBC: 0 % (ref 0.0–0.2)

## 2021-05-31 LAB — AMMONIA: Ammonia: UNDETERMINED umol/L (ref 9–35)

## 2021-05-31 MED ORDER — THIAMINE HCL 100 MG PO TABS: 100.0000 mg | ORAL_TABLET | Freq: Every day | ORAL | 2 refills | Status: DC

## 2021-05-31 MED ORDER — ADULT MULTIVITAMIN W/MINERALS CH: 1.0000 | ORAL_TABLET | Freq: Every day | ORAL | Status: DC

## 2021-05-31 MED ORDER — PREDNISONE 20 MG PO TABS
40.0000 mg | ORAL_TABLET | Freq: Every day | ORAL | 0 refills | Status: DC
Start: 2021-06-01 — End: 2021-06-19

## 2021-05-31 MED ORDER — CALCIUM CARBONATE ANTACID 500 MG PO CHEW: 1.0000 | CHEWABLE_TABLET | Freq: Three times a day (TID) | ORAL | Status: DC | PRN

## 2021-05-31 MED ORDER — SPIRONOLACTONE 100 MG PO TABS: 100.0000 mg | ORAL_TABLET | Freq: Every day | ORAL | 2 refills | Status: DC

## 2021-05-31 MED ORDER — FOLIC ACID 1 MG PO TABS: 1.0000 mg | ORAL_TABLET | Freq: Every day | ORAL | 2 refills | Status: DC

## 2021-05-31 MED ORDER — FUROSEMIDE 40 MG PO TABS: 40.0000 mg | ORAL_TABLET | Freq: Every day | ORAL | 2 refills | Status: DC

## 2021-05-31 NOTE — TOC Transition Note (Signed)
Transition of Care (TOC) - CM/SW Discharge Note ? ? ?Patient Details  ?Name: Anthony Torres ?MRN: [id number] ?Date of Birth: [birth year] ? ?Transition of Care (TOC) CM/SW Contact:  ?Truddie Hidden, RN ?Phone Number: ?05/31/2021, 3:15 PM ? ? ?Clinical Narrative:    ? Appointment arranged at [facility] for PCP. Information added to AVS. TOC signing off ? ? ?  ?Barriers to Discharge: Continued Medical Work up ? ? ?Patient Goals and CMS Choice ?  ?  ?  ? ?Discharge Placement ?  ?           ?  ?  ?  ?  ? ?Discharge Plan and Services ?  ?  ?Post Acute Care Choice: NA          ?  ?  ?  ?  ?  ?  ?  ?  ?  ?  ? ?Social Determinants of Health (SDOH) Interventions ?  ? ? ?Readmission Risk Interventions ?   ? View : No data to display.  ?  ?  ?  ? ? ? ? ? ?

## 2021-05-31 NOTE — Discharge Summary (Signed)
?Physician Discharge Summary ?  ?Patient: Anthony Torres MRN: [id number] DOB: 07/28/1980  ?Admit date:     05/26/2021  ?Discharge date: 05/31/21  ?Discharge Physician: Pennie BanterKelly A Meila Berke  ? ?PCP: Pcp, No  ? ?Recommendations at discharge:  ? ? Follow up at [facility] on Monday for labs (CMP, PT/INR, CBC) ?Follow up with [facility] as scheduled, call on Monday if you don't already have follow up appointment scheduled ?Establish with Primary Care for ongoing follow up ? ? ?Discharge Diagnoses: ?Principal Problem: ?  Alcoholic hepatitis ?Active Problems: ?  Hyperbilirubinemia ?  Hyponatremia ?  Hypokalemia ?  Hypoalbuminemia ?  Macrocytic anemia ?  Leukocytosis ?  Folic acid deficiency ? ? ?Hospital Course: ?41 year old admitted for elevated LFTs and jaundice secondary to alcoholic hepatitis.  Patient had already weaned himself down to 1 drink per night.  Has not shown signs of withdrawal.  Committed to avoiding alcohol going forward. ? ?4/17: Tbili down slightly 33.7>>32.5 ?4/18: Tbili up again 33.2.  GI recommended starting prednisone ?4/19: Tbili unchanged 33.2 otherwise LFT's improving ? ? ?Assessment and Plan: ?* Alcoholic hepatitis ?Maddrey Discriminant Factor 53 on admission.  Seen by GI.   ?GI not planning any inpatient luminal evaluation recommending outpatient follow-up.  ? ?Counseling provided for alcohol cessation, AA resources given.  ?No signs of withdrawal as pt had already self-tapered off alcohol prior to admission was having 1 drink each night.  ?  ?Hepatitis panel negative.   ?Iron studies suggestive of acute phase reaction.   ?HIV negative.   ?Autoimmune markers pending. ? ?GI recommended to start on steroids.  ?--Continue prednisone 40 mg PO daily x 7 days.  If improvement in Maddrey discriminant factor and Lille score on day 7, need to continue steroids for total 28 days followed by taper (if stops alcohol). ? ? ? ? ?Hyperbilirubinemia ?T. bili trend: 33.7 >> 32.5 >> 33.2 >> 33.2 >>  31.7 ?Due to alcoholic hepatitis.   ?Continue prednisone. ?Monitor hepatic function. ? ?Hyponatremia ?Na trend: 126 >> 127 >> 123 >> 125 >> 125 >> 127>>127. ? ?Felt due to volume depletion.  Started on IV fluids yesterday, sodium level unchanged.  Patient has significant third spacing edema. ? ?-- Stop IV fluids (4/19) ?-- Given 40 mg IV Lasix x1 after IV albumin (4/19) ?-- Repeat above again today ?-- Then start Lasix 40 mg BID ?-- Increase spironolactone 25 >> 50 mg daily ?--Strict I/O's and daily weights ?-- Monitor BMP daily ?-- Titrate up diuretics as electrolytes, renal function, and blood pressure tolerate ? ?Hypokalemia ?Resolved with replacement.  K today 3.9.  Monitor and replace K as needed. ? ?Hypoalbuminemia ?Secondary to liver disease.  Patient with significant peripheral edema due to third spacing.  Stopped IV fluids yesterday. ?Given IV Lasix after IV albumin. ?--Repeat IV Lasix and albumin today ? ?Leukocytosis ?Due to steroids. ?No evidence of infection. ?Monitor CBC. ? 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  No GI bleeding. ?Folic acid deficient, started on supplement. ? ?Hbg down to 7.2 this AM. ?Repeat later today 8.5. ? ?Hold VTE chemoprophylaxis for now. ?SCD's ? ?Folic acid deficiency ?Continue folic acid supplement ? ? ? ? ?  ? ? ?Consultants: GI ?Procedures performed: none  ?Disposition: Home ?Diet recommendation:  ?Discharge Diet Orders (From admission, onward)  ? ?  Start     Ordered  ? 05/31/21 0000  Diet - low sodium heart healthy       ? 05/31/21 1421  ? ?  ?  ? ?  ? ?  Cardiac diet ?DISCHARGE MEDICATION: ?Allergies as of 05/31/2021   ?No Known Allergies ?  ? ?  ?Medication List  ?  ? ?STOP taking these medications   ? ?ondansetron 4 MG disintegrating tablet ?Commonly known as: Zofran ODT ?  ? ?  ? ?TAKE these medications   ? ?calcium carbonate 500 MG chewable tablet ?Commonly known as: TUMS - dosed in mg elemental calcium ?Chew 1 tablet (200 mg of elemental calcium  total) by mouth 3 (three) times daily as needed for indigestion or heartburn. ?  ?folic acid 1 MG tablet ?Commonly known as: FOLVITE ?Take 1 tablet (1 mg total) by mouth daily. ?Start taking on: June 01, 2021 ?  ?furosemide 40 MG tablet ?Commonly known as: LASIX ?Take 1 tablet (40 mg total) by mouth daily. ?  ?multivitamin with minerals Tabs tablet ?Take 1 tablet by mouth daily. ?Start taking on: June 01, 2021 ?  ?pantoprazole 40 MG tablet ?Commonly known as: Protonix ?Take 1 tablet (40 mg total) by mouth daily for 14 days. ?  ?predniSONE 20 MG tablet ?Commonly known as: DELTASONE ?Take 2 tablets (40 mg total) by mouth daily with breakfast for 28 days. ?Start taking on: June 01, 2021 ?  ?spironolactone 100 MG tablet ?Commonly known as: ALDACTONE ?Take 1 tablet (100 mg total) by mouth daily. ?Start taking on: June 01, 2021 ?  ?thiamine 100 MG tablet ?Take 1 tablet (100 mg total) by mouth daily. ?Start taking on: June 01, 2021 ?  ? ?  ? ? ?Discharge Exam: ?Filed Weights  ? 05/26/21 0639 05/30/21 0500 05/31/21 0424  ?Weight: 113.4 kg 117.1 kg 112.9 kg  ? ?General exam: awake, alert, no acute distress ?HEENT: icteric sclera, moist mucus membranes, hearing grossly normal  ?Respiratory system: CTAB, no wheezes, rales or rhonchi, normal respiratory effort. ?Cardiovascular system: normal S1/S2, RRR, no JVD, murmurs, rubs, gallops,  piyying edema stable edema.   ?Gastrointestinal system: soft, NT, ND, no HSM felt, +bowel sounds. ?Central nervous system: A&O x3. no gross focal neurologic deficits, normal speech ?Skin: dry, intact, normal temperature, jaundiced ?Psychiatry: normal mood, congruent affect, judgement and insight appear normal ? ? ?Condition at discharge: stable ? ?The results of significant diagnostics from this hospitalization (including imaging, microbiology, ancillary and laboratory) are listed below for reference.  ? ?Imaging Studies: ?CT ABDOMEN PELVIS WO CONTRAST ? ?Result Date: 05/26/2021 ?CLINICAL  DATA:  41 year old male with neutropenia, jaundice, dark urine, possible ascites. EXAM: CT ABDOMEN AND PELVIS WITHOUT CONTRAST TECHNIQUE: Multidetector CT imaging of the abdomen and pelvis was performed following the standard protocol without IV contrast. RADIATION DOSE REDUCTION: This exam was performed according to the departmental dose-optimization program which includes automated exposure control, adjustment of the mA and/or kV according to patient size and/or use of iterative reconstruction technique. COMPARISON:  CTA Chest, Abdomen, and Pelvis 12/05/2020. FINDINGS: Lower chest: No cardiomegaly or pericardial effusion. No pleural effusion. Negative lung bases. Hepatobiliary: Hepatomegaly and small volume perihepatic ascites with simple fluid density. No discrete liver lesion in the absence of contrast. Chronic cholelithiasis. Relatively contracted gallbladder. Pancreas: Negative noncontrast appearance. Spleen: Chronic splenomegaly, nearly 20 cm spleen appears stable from last year. Small volume perisplenic ascites with simple fluid density. Adrenals/Urinary Tract: Negative noncontrast appearance. Stomach/Bowel: No dilated large or small bowel. Reactive appearing lymph nodes and venous congestion at the root of the mesentery. Evidence of a normal appendix within ascites fluid on coronal image 53. No convincing bowel inflammation. Decompressed stomach and duodenum. Vascular/Lymphatic: Normal caliber abdominal aorta. No calcified atherosclerosis  or abnormally enlarged lymph nodes. However, there is an increased number of small lymph nodes in the abdomen and retroperitoneum. Small caliber varices in the ventral greater omentum. Reproductive: Negative. Other: Small volume ascites in the anterior pelvis. Musculoskeletal: Chronic lower lumbar disc and endplate degeneration. Previous left femur ORIF. No acute or suspicious osseous lesion identified. Borderline to mild anasarca. IMPRESSION: 1. 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 Suspect Cirrhosis. No discrete liver lesion in the absence of contrast. 2. Choleli

## 2021-05-31 NOTE — Progress Notes (Signed)
Mobility Specialist - Progress Note ? ? ? 05/31/21 1400  ?Mobility  ?Activity Ambulated independently in hallway;Stood at bedside;Dangled on edge of bed  ?Level of Assistance Independent  ?Assistive Device None  ?Distance Ambulated (ft) 320 ft  ?Activity Response Tolerated well  ?$Mobility charge 1 Mobility  ? ? ?During mobility: 101 HR, 97% SpO2 ? ?Pt sitting in bed upon arrival using RA. Completes EOB + STS and ambulates indep voicing no complaints. Pt returns to bed for needs in reach. ? ?Anthony Torres ?Mobility Specialist ?05/31/21, 2:26 PM ? ? ? ? ?

## 2021-06-18 ENCOUNTER — Observation Stay: Payer: BC Managed Care – PPO

## 2021-06-18 ENCOUNTER — Observation Stay
Admission: EM | Admit: 2021-06-18 | Discharge: 2021-06-19 | Disposition: A | Discharge status: Home / Self Care | Payer: BC Managed Care – PPO | Attending: Osteopathic Medicine | Admitting: Osteopathic Medicine

## 2021-06-18 ENCOUNTER — Other Ambulatory Visit: Payer: Self-pay

## 2021-06-18 DIAGNOSIS — K7460 Unspecified cirrhosis of liver: Secondary | ICD-10-CM

## 2021-06-18 DIAGNOSIS — K7031 Alcoholic cirrhosis of liver with ascites: Secondary | ICD-10-CM | POA: Diagnosis not present

## 2021-06-18 DIAGNOSIS — R140 Abdominal distension (gaseous): Secondary | ICD-10-CM

## 2021-06-18 DIAGNOSIS — Z87891 Personal history of nicotine dependence: Secondary | ICD-10-CM | POA: Diagnosis not present

## 2021-06-18 DIAGNOSIS — E871 Hypo-osmolality and hyponatremia: Secondary | ICD-10-CM | POA: Insufficient documentation

## 2021-06-18 DIAGNOSIS — D649 Anemia, unspecified: Secondary | ICD-10-CM | POA: Diagnosis not present

## 2021-06-18 DIAGNOSIS — D638 Anemia in other chronic diseases classified elsewhere: Secondary | ICD-10-CM

## 2021-06-18 DIAGNOSIS — R531 Weakness: Secondary | ICD-10-CM | POA: Diagnosis present

## 2021-06-18 DIAGNOSIS — D689 Coagulation defect, unspecified: Secondary | ICD-10-CM | POA: Insufficient documentation

## 2021-06-18 DIAGNOSIS — K7010 Alcoholic hepatitis without ascites: Secondary | ICD-10-CM | POA: Diagnosis present

## 2021-06-18 DIAGNOSIS — Z79899 Other long term (current) drug therapy: Secondary | ICD-10-CM | POA: Insufficient documentation

## 2021-06-18 DIAGNOSIS — R188 Other ascites: Secondary | ICD-10-CM

## 2021-06-18 LAB — HEMOGLOBIN AND HEMATOCRIT, BLOOD
HCT: 24.7 % — ABNORMAL LOW (ref 39.0–52.0)
Hemoglobin: 8.6 g/dL — ABNORMAL LOW (ref 13.0–17.0)

## 2021-06-18 LAB — COMPREHENSIVE METABOLIC PANEL
ALT: 70 U/L — ABNORMAL HIGH (ref 0–44)
AST: 134 U/L — ABNORMAL HIGH (ref 15–41)
Albumin: 2.1 g/dL — ABNORMAL LOW (ref 3.5–5.0)
Alkaline Phosphatase: 272 U/L — ABNORMAL HIGH (ref 38–126)
Anion gap: 10 (ref 5–15)
BUN: 37 mg/dL — ABNORMAL HIGH (ref 6–20)
CO2: 17 mmol/L — ABNORMAL LOW (ref 22–32)
Calcium: 8.1 mg/dL — ABNORMAL LOW (ref 8.9–10.3)
Chloride: 98 mmol/L (ref 98–111)
Creatinine, Ser: UNDETERMINED mg/dL (ref 0.61–1.24)
Glucose, Bld: 114 mg/dL — ABNORMAL HIGH (ref 70–99)
Potassium: 5 mmol/L (ref 3.5–5.1)
Sodium: 125 mmol/L — ABNORMAL LOW (ref 135–145)
Total Bilirubin: 30.9 mg/dL (ref 0.3–1.2)
Total Protein: 7 g/dL (ref 6.5–8.1)

## 2021-06-18 LAB — URINALYSIS, ROUTINE W REFLEX MICROSCOPIC
Glucose, UA: NEGATIVE mg/dL
Hgb urine dipstick: NEGATIVE
Ketones, ur: NEGATIVE mg/dL
Leukocytes,Ua: NEGATIVE
Nitrite: NEGATIVE
Protein, ur: NEGATIVE mg/dL
Specific Gravity, Urine: 1.012 (ref 1.005–1.030)
pH: 6 (ref 5.0–8.0)

## 2021-06-18 LAB — CBC
HCT: 21.3 % — ABNORMAL LOW (ref 39.0–52.0)
Hemoglobin: 7.2 g/dL — ABNORMAL LOW (ref 13.0–17.0)
MCH: 35.6 pg — ABNORMAL HIGH (ref 26.0–34.0)
MCHC: 33.8 g/dL (ref 30.0–36.0)
MCV: 105.4 fL — ABNORMAL HIGH (ref 80.0–100.0)
Platelets: 173 10*3/uL (ref 150–400)
RBC: 2.02 MIL/uL — ABNORMAL LOW (ref 4.22–5.81)
RDW: 13.4 % (ref 11.5–15.5)
WBC: 17.8 10*3/uL — ABNORMAL HIGH (ref 4.0–10.5)
nRBC: 0 % (ref 0.0–0.2)

## 2021-06-18 LAB — TSH: TSH: 0.843 u[IU]/mL (ref 0.350–4.500)

## 2021-06-18 LAB — APTT: aPTT: 38 seconds — ABNORMAL HIGH (ref 24–36)

## 2021-06-18 LAB — BLOOD GAS, VENOUS
Acid-base deficit: 6.2 mmol/L — ABNORMAL HIGH (ref 0.0–2.0)
Bicarbonate: 17.9 mmol/L — ABNORMAL LOW (ref 20.0–28.0)
O2 Saturation: 52.2 %
Patient temperature: 37
pCO2, Ven: 31 mmHg — ABNORMAL LOW (ref 44–60)
pH, Ven: 7.37 (ref 7.25–7.43)
pO2, Ven: 34 mmHg (ref 32–45)

## 2021-06-18 LAB — AMMONIA: Ammonia: 35 umol/L (ref 9–35)

## 2021-06-18 LAB — PROTIME-INR
INR: 1.4 — ABNORMAL HIGH (ref 0.8–1.2)
Prothrombin Time: 16.7 seconds — ABNORMAL HIGH (ref 11.4–15.2)

## 2021-06-18 LAB — ABO/RH: ABO/RH(D): O POS

## 2021-06-18 LAB — PREPARE RBC (CROSSMATCH)

## 2021-06-18 LAB — GLUCOSE, CAPILLARY: Glucose-Capillary: 150 mg/dL — ABNORMAL HIGH (ref 70–99)

## 2021-06-18 MED ORDER — SPIRONOLACTONE 25 MG PO TABS
100.0000 mg | ORAL_TABLET | Freq: Every day | ORAL | Status: DC
  Administered 2021-06-18 – 2021-06-19 (×2): 100 mg via ORAL
  Filled 2021-06-18 (×2): qty 4

## 2021-06-18 MED ORDER — PREDNISONE 20 MG PO TABS
40.0000 mg | ORAL_TABLET | Freq: Every day | ORAL | Status: DC
  Administered 2021-06-18 – 2021-06-19 (×2): 40 mg via ORAL
  Filled 2021-06-18 (×2): qty 2

## 2021-06-18 MED ORDER — CALCIUM CARBONATE ANTACID 500 MG PO CHEW: 1.0000 | CHEWABLE_TABLET | Freq: Three times a day (TID) | ORAL | Status: DC | PRN

## 2021-06-18 MED ORDER — LACTULOSE 10 GM/15ML PO SOLN
30.0000 g | Freq: Two times a day (BID) | ORAL | Status: DC
  Administered 2021-06-18 – 2021-06-19 (×3): 30 g via ORAL
  Filled 2021-06-18 (×3): qty 60

## 2021-06-18 MED ORDER — PANTOPRAZOLE SODIUM 40 MG PO TBEC
40.0000 mg | DELAYED_RELEASE_TABLET | Freq: Every day | ORAL | Status: DC
  Administered 2021-06-18 – 2021-06-19 (×2): 40 mg via ORAL
  Filled 2021-06-18 (×2): qty 1

## 2021-06-18 MED ORDER — FOLIC ACID 1 MG PO TABS
1.0000 mg | ORAL_TABLET | Freq: Every day | ORAL | Status: DC
  Administered 2021-06-18 – 2021-06-19 (×2): 1 mg via ORAL
  Filled 2021-06-18 (×2): qty 1

## 2021-06-18 MED ORDER — FUROSEMIDE 10 MG/ML IJ SOLN
40.0000 mg | Freq: Two times a day (BID) | INTRAMUSCULAR | Status: DC
  Administered 2021-06-18 – 2021-06-19 (×2): 40 mg via INTRAVENOUS
  Filled 2021-06-18 (×2): qty 4

## 2021-06-18 MED ORDER — SODIUM CHLORIDE 0.9% IV SOLUTION
Freq: Once | INTRAVENOUS | Status: AC
  Filled 2021-06-18: qty 250

## 2021-06-18 MED ORDER — THIAMINE HCL 100 MG PO TABS
100.0000 mg | ORAL_TABLET | Freq: Every day | ORAL | Status: DC
  Administered 2021-06-18 – 2021-06-19 (×2): 100 mg via ORAL
  Filled 2021-06-18 (×2): qty 1

## 2021-06-18 NOTE — Progress Notes (Signed)
D/W on call GI Dr. Tobi Bastos, since there is no changes on his LFT with recent 2 wks steroid treatment, no indication to restart steroid at this point. ? ?D/W ultrasound team, no ascites on U/S, paracentesis cancelled. ?

## 2021-06-18 NOTE — ED Notes (Signed)
Blood transfusion completed prior to arrival of this RN. Documented completion of blood hung by previous clinician. Full set of VS updated. Standing order for repeat H&H placed. Pt denies any s/s following completion of blood transfusion.  ?

## 2021-06-18 NOTE — ED Notes (Signed)
ED TO INPATIENT HANDOFF REPORT ? ?ED Nurse Name and Phone #: Fredric MareBailey [phone] ? ?S ?Name/Age/Gender ?Anthony Torres ?41 y.o. ?male ?Room/Bed: ED12A/ED12A ? ?Code Status ?  Code Status: Full Code ? ?Home/SNF/Other ?Home ?Patient oriented to: self, place, time, and situation ?Is this baseline? Yes  ? ?Triage Complete: Triage complete  ?Chief Complaint ?Symptomatic anemia [D64.9] ? ?Triage Note ?Pt comes with c/o increased weakness, fatigue and constipation. Pt states abdominal pain. 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 Pt is extremely jaundiced. Pt states hx of alcohol abuse but no longer drinks.   ? ?Allergies ?No Known Allergies ? ?Level of Care/Admitting Diagnosis ?ED Disposition   ? ? ED Disposition  ?Admit  ? Condition  ?--  ? Comment  ?Hospital Area: [facility] [[id number]] ? Level of Care: Med-Surg [16] ? Covid Evaluation: Asymptomatic - no recent exposure (last 10 days) testing not required ? Diagnosis: Symptomatic anemia [0272536][[id number]] ? Admitting Physician: Emeline GeneralZHANG, PING T [[id number]] ? Attending Physician: Emeline GeneralZHANG, PING T [[id number]] ?  ?  ? ?  ? ? ?B ?Medical/Surgery History ?History reviewed. No pertinent past medical history. ?History reviewed. No pertinent surgical history.  ? ?A ?IV Location/Drains/Wounds ?Patient Lines/Drains/Airways Status   ? ? Active Line/Drains/Airways   ? ? Name Placement date Placement time Site Days  ? Peripheral IV 05/26/21 20 G Anterior;Distal;Right;Upper Arm 05/26/21  0645  Arm  23  ? Peripheral IV 06/18/21 20 G Anterior;Right;Upper Arm 06/18/21  1133  Arm  less than 1  ? ?  ?  ? ?  ? ? ?Intake/Output Last 24 hours ? ?Intake/Output Summary (Last 24 hours) at 06/18/2021 1945 ?Last data filed at 06/18/2021 1938 ?Gross per 24 hour  ?Intake --  ?Output 800 ml  ?Net -800 ml  ? ? ?Labs/Imaging ?Results for orders placed or performed during the hospital encounter of 06/18/21 (from the past 48 hour(s))  ?CBC     Status: Abnormal  ? Collection Time: 06/18/21 10:25 AM  ?Result Value Ref  Range  ? WBC 17.8 (H) 4.0 - 10.5 K/uL  ? RBC 2.02 (L) 4.22 - 5.81 MIL/uL  ? Hemoglobin 7.2 (L) 13.0 - 17.0 g/dL  ? HCT 21.3 (L) 39.0 - 52.0 %  ? MCV 105.4 (H) 80.0 - 100.0 fL  ? MCH 35.6 (H) 26.0 - 34.0 pg  ? MCHC 33.8 30.0 - 36.0 g/dL  ? RDW 13.4 11.5 - 15.5 %  ? Platelets 173 150 - 400 K/uL  ? nRBC 0.0 0.0 - 0.2 %  ?  Comment: Performed at [facility], [address] [place], [place] [address]  ?Urinalysis, Routine w reflex microscopic     Status: Abnormal  ? Collection Time: 06/18/21 10:25 AM  ?Result Value Ref Range  ? Color, Urine AMBER (A) YELLOW  ?  Comment: BIOCHEMICALS MAY BE AFFECTED BY COLOR  ? APPearance CLEAR (A) CLEAR  ? Specific Gravity, Urine 1.012 1.005 - 1.030  ? pH 6.0 5.0 - 8.0  ? Glucose, UA NEGATIVE NEGATIVE mg/dL  ? Hgb urine dipstick NEGATIVE NEGATIVE  ? Bilirubin Urine MODERATE (A) NEGATIVE  ? Ketones, ur NEGATIVE NEGATIVE mg/dL  ? Protein, ur NEGATIVE NEGATIVE mg/dL  ? Nitrite NEGATIVE NEGATIVE  ? Leukocytes,Ua NEGATIVE NEGATIVE  ?  Comment: Performed at [facility], [address] [place], [place] [address]  ?Comprehensive metabolic panel     Status: Abnormal  ? Collection Time: 06/18/21 10:25 AM  ?Result Value Ref Range  ? Sodium 125 (L) 135 - 145  mmol/L  ? Potassium 5.0 3.5 - 5.1 mmol/L  ? Chloride 98 98 - 111 mmol/L  ? CO2 17 (L) 22 - 32 mmol/L  ? Glucose, Bld 114 (H) 70 - 99 mg/dL  ?  Comment: Glucose reference range applies only to samples taken after fasting for at least 8 hours.  ? BUN 37 (H) 6 - 20 mg/dL  ? Creatinine, Ser UNABLE TO REPORT DUE TO ICTERUS 0.61 - 1.24 mg/dL  ? Calcium 8.1 (L) 8.9 - 10.3 mg/dL  ? Total Protein 7.0 6.5 - 8.1 g/dL  ? Albumin 2.1 (L) 3.5 - 5.0 g/dL  ? AST 134 (H) 15 - 41 U/L  ? ALT 70 (H) 0 - 44 U/L  ? Alkaline Phosphatase 272 (H) 38 - 126 U/L  ? Total Bilirubin 30.9 (HH) 0.3 - 1.2 mg/dL  ?  Comment: CRITICAL RESULT CALLED TO, READ BACK BY AND VERIFIED WITH: ?JOHN VANDRUFF@1205  ON 06/18/21 BY HKP ?  ? GFR, Estimated NOT CALCULATED  >60 mL/min  ?  Comment: (NOTE) ?Calculated using the CKD-EPI Creatinine Equation (2021) ?  ? Anion gap 10 5 - 15  ?  Comment: Performed at [facility], [address], [place], [place] [address]  ?APTT     Status: Abnormal  ? Collection Time: 06/18/21 10:31 AM  ?Result Value Ref Range  ? aPTT 38 (H) 24 - 36 seconds  ?  Comment:        ?IF BASELINE aPTT IS ELEVATED, ?SUGGEST PATIENT RISK ASSESSMENT ?BE USED TO DETERMINE APPROPRIATE ?ANTICOAGULANT THERAPY. ?Performed at [facility], [address] [place], [place] [address] ?  ?Protime-INR     Status: Abnormal  ? Collection Time: 06/18/21 10:31 AM  ?Result Value Ref Range  ? Prothrombin Time 16.7 (H) 11.4 - 15.2 seconds  ? INR 1.4 (H) 0.8 - 1.2  ?  Comment: (NOTE) ?INR goal varies based on device and disease states. ?Performed at [facility], [address] [place], ?[place] [address] ?  ?TSH     Status: None  ? Collection Time: 06/18/21 11:13 AM  ?Result Value Ref Range  ? TSH 0.843 0.350 - 4.500 uIU/mL  ?  Comment: Performed by a 3rd Generation assay with a functional sensitivity of <=0.01 uIU/mL. ?Performed at [facility], [address], [place], [place] [address] ?  ?Ammonia     Status: None  ? Collection Time: 06/18/21 11:39 AM  ?Result Value Ref Range  ? Ammonia 35 9 - 35 umol/L  ?  Comment: ICTERUS SAMPLE ITERPRET RESULT WITH CAUTION. ?Performed at [facility], [address] [place], [place] [address] ?  ?Prepare RBC (crossmatch)     Status: None  ? Collection Time: 06/18/21  2:02 PM  ?Result Value Ref Range  ? Order Confirmation    ?  ORDER PROCESSED BY BLOOD BANK ?Performed at [facility], [address], [place], [place] [address] ?  ?Blood gas, venous     Status: Abnormal  ? Collection Time: 06/18/21  2:03 PM  ?Result Value Ref Range  ? pH, Ven 7.37 7.25 - 7.43  ? pCO2, Ven 31 (L) 44 - 60 mmHg  ? pO2, Ven 34 32 - 45 mmHg  ? Bicarbonate 17.9 (L) 20.0 - 28.0 mmol/L  ? Acid-base deficit 6.2  (H) 0.0 - 2.0 mmol/L  ? O2 Saturation 52.2 %  ? Patient temperature 37.0   ?  Comment: Performed at [facility], [address], [place], [place]  [address]  ?Type and screen [facility]     Status: None (Preliminary result)  ? Collection Time: 06/18/21  2:08 PM  ?Result Value Ref Range  ? ABO/RH(D) O POS   ? Antibody Screen NEG   ? Sample Expiration 06/21/2021,2359   ? Unit Number [id number]   ? Blood Component Type RED CELLS,LR   ? Unit division 00   ? Status of Unit ISSUED   ? Transfusion Status OK TO TRANSFUSE   ? Crossmatch Result    ?  Compatible ?Performed at [facility], [address], [place], [place] [address] ?  ?ABO/Rh     Status: None  ? Collection Time: 06/18/21  2:20 PM  ?Result Value Ref Range  ? ABO/RH(D)    ?  O POS ?Performed at [facility], [address], [place], [place] [address] ?  ? ?US Abdomen Limited ? ?Result Date: 06/18/2021 ?CLINICAL DATA:  Abdominal distension EXAM: LIMITED ABDOMEN ULTRASOUND FOR ASCITES TECHNIQUE: Limited ultrasound survey for ascites was performed in all four abdominal quadrants. COMPARISON:  05/26/2021 ultrasound of the right upper quadrant. FINDINGS: No free fluid seen within the abdomen. IMPRESSION: No evidence of ascites. Electronically Signed   By: Wiliam Ke M.D.   On: 06/18/2021 15:11  ? ?US Venous Img Lower Bilateral (DVT) ? ?Result Date: 06/18/2021 ?CLINICAL DATA:  Swelling. EXAM: BILATERAL LOWER EXTREMITY VENOUS DOPPLER ULTRASOUND TECHNIQUE: Gray-scale sonography with graded compression, as well as color Doppler and duplex ultrasound were performed to evaluate the lower extremity deep venous systems from the level of the common femoral vein and including the common femoral, femoral, profunda femoral, popliteal and calf veins including the posterior tibial, peroneal and gastrocnemius veins when visible. The superficial great saphenous vein was also interrogated. Spectral Doppler was utilized to evaluate  flow at rest and with distal augmentation maneuvers in the common femoral, femoral and popliteal veins. COMPARISON:  None Available. FINDINGS: RIGHT LOWER EXTREMITY Common Femoral Vein: No evidence of

## 2021-06-18 NOTE — ED Notes (Signed)
Attempted IV unable to obtain, did get labs and UA sent  to lab ?

## 2021-06-18 NOTE — ED Provider Notes (Signed)
? ?[facility] ?Provider Note ? ? ? Event Date/Time  ? First MD Initiated Contact with Patient 06/18/21 1129   ?  (approximate) ? ? ?History  ? ?Weakness ? ? ?HPI ? ?Anthony Torres is a 41 y.o. male with a history of cirrhosis who presents with complaints of fatigue and severe weakness.  Patient reports he is typically able to walk his dog but recently has been unable to walk down the stairs of his front porch because he is so exhausted.  He denies chest pain.  No shortness of breath.  No cough.  He has GI appointment for further evaluation of his cirrhosis ?  ? ? ?Physical Exam  ? ?Triage Vital Signs: ?ED Triage Vitals  ?Enc Vitals Group  ?   BP 06/18/21 1059 (!) 117/51  ?   Pulse Rate 06/18/21 1059 94  ?   Resp 06/18/21 1059 18  ?   Temp 06/18/21 1059 98.2 ?F (36.8 ?C)  ?   Temp src --   ?   SpO2 06/18/21 1059 100 %  ?   Weight --   ?   Height --   ?   Head Circumference --   ?   Peak Flow --   ?   Pain Score 06/18/21 1024 8  ?   Pain Loc --   ?   Pain Edu? --   ?   Excl. in GC? --   ? ? ?Most recent vital signs: ?Vitals:  ? 06/18/21 1059 06/18/21 1150  ?BP: (!) 117/51 120/63  ?Pulse: 94 89  ?Resp: 18 14  ?Temp: 98.2 ?F (36.8 ?C) 98.5 ?F (36.9 ?C)  ?SpO2: 100% 100%  ? ? ? ?General: Awake, no distress.  Jaundiced ?CV:  Good peripheral perfusion.  Regular rate and rhythm ?Resp:  Normal effort.  Clear to auscultation bilaterally ?Abd:  No distention.  No tenderness to palpation ?Other:   ? ? ?ED Results / Procedures / Treatments  ? ?Labs ?(all labs ordered are listed, but only abnormal results are displayed) ?Labs Reviewed  ?CBC - Abnormal; Notable for the following components:  ?    Result Value  ? WBC 17.8 (*)   ? RBC 2.02 (*)   ? Hemoglobin 7.2 (*)   ? HCT 21.3 (*)   ? MCV 105.4 (*)   ? MCH 35.6 (*)   ? All other components within normal limits  ?URINALYSIS, ROUTINE W REFLEX MICROSCOPIC - Abnormal; Notable for the following components:  ? Color, Urine AMBER (*)   ? APPearance CLEAR (*)   ?  Bilirubin Urine MODERATE (*)   ? All other components within normal limits  ?COMPREHENSIVE METABOLIC PANEL - Abnormal; Notable for the following components:  ? Sodium 125 (*)   ? CO2 17 (*)   ? Glucose, Bld 114 (*)   ? BUN 37 (*)   ? Calcium 8.1 (*)   ? Albumin 2.1 (*)   ? AST 134 (*)   ? ALT 70 (*)   ? Alkaline Phosphatase 272 (*)   ? Total Bilirubin 30.9 (*)   ? All other components within normal limits  ?APTT - Abnormal; Notable for the following components:  ? aPTT 38 (*)   ? All other components within normal limits  ?PROTIME-INR - Abnormal; Notable for the following components:  ? Prothrombin Time 16.7 (*)   ? INR 1.4 (*)   ? All other components within normal limits  ?BLOOD GAS, VENOUS - Abnormal; Notable for  the following components:  ? pCO2, Ven 31 (*)   ? Bicarbonate 17.9 (*)   ? Acid-base deficit 6.2 (*)   ? All other components within normal limits  ?AMMONIA  ?TSH  ?CBG MONITORING, ED  ?TYPE AND SCREEN  ?ABO/RH  ?PREPARE RBC (CROSSMATCH)  ? ? ? ?EKG ?ED ECG REPORT ?I, Jene Every, the attending physician, personally viewed and interpreted this ECG. ? ?Date: 06/18/2021 ? ?Rhythm: normal sinus rhythm ?QRS Axis: normal ?Intervals: normal ?ST/T Wave abnormalities: normal ?Narrative Interpretation: no evidence of acute ischemia ? ? ? ? ?RADIOLOGY ? ? ? ? ?PROCEDURES: ? ?Critical Care performed: yes ? ?CRITICAL CARE ?Performed by: Jene Every ? ? ?Total critical care time: 30 minutes ? ?Critical care time was exclusive of separately billable procedures and treating other patients. ? ?Critical care was necessary to treat or prevent imminent or life-threatening deterioration. ? ?Critical care was time spent personally by me on the following activities: development of treatment plan with patient and/or surrogate as well as nursing, discussions with consultants, evaluation of patient's response to treatment, examination of patient, obtaining history from patient or surrogate, ordering and performing treatments  and interventions, ordering and review of laboratory studies, ordering and review of radiographic studies, pulse oximetry and re-evaluation of patient's condition. ? ? ?Procedures ? ? ?MEDICATIONS ORDERED IN ED: ?Medications  ?furosemide (LASIX) injection 40 mg (has no administration in time range)  ?spironolactone (ALDACTONE) tablet 100 mg (100 mg Oral Given 06/18/21 1445)  ?predniSONE (DELTASONE) tablet 40 mg (40 mg Oral Given 06/18/21 1445)  ?calcium carbonate (TUMS - dosed in mg elemental calcium) chewable tablet 200 mg of elemental calcium (has no administration in time range)  ?pantoprazole (PROTONIX) EC tablet 40 mg (40 mg Oral Given 06/18/21 1445)  ?folic acid (FOLVITE) tablet 1 mg (1 mg Oral Given 06/18/21 1445)  ?thiamine tablet 100 mg (100 mg Oral Given 06/18/21 1445)  ?lactulose (CHRONULAC) 10 GM/15ML solution 30 g (30 g Oral Given 06/18/21 1445)  ?0.9 %  sodium chloride infusion (Manually program via Guardrails IV Fluids) ( Intravenous New Bag/Given 06/18/21 1446)  ? ? ? ?IMPRESSION / MDM / ASSESSMENT AND PLAN / ED COURSE  ?I reviewed the triage vital signs and the nursing notes. ? ? ? ?Patient presents with severe fatigue as detailed above.  Given his comorbidities this is likely a chronic illness with a severe exacerbation exacerbation ? ?Differential includes electrolyte abnormalities, anemia, worsening liver failure ? ?Lab work demonstrates bilirubin of 30 which is in line with prior levels ? ?However his sodium is 125 and his hemoglobin is 7.2.  Both of these are likely contributing to his severe fatigue ? ?IV fluids are infusing.  Type and screen obtained ? ?I discussed with the hospitalist for admission ? ?  ? ? ?FINAL CLINICAL IMPRESSION(S) / ED DIAGNOSES  ? ?Final diagnoses:  ?Hyponatremia  ?Anemia in other chronic diseases classified elsewhere  ?Cirrhosis of liver with ascites, unspecified hepatic cirrhosis type (HCC)  ? ? ? ?Rx / DC Orders  ? ?ED Discharge Orders   ? ? None  ? ?  ? ? ? ?Note:  This  document was prepared using Dragon voice recognition software and may include unintentional dictation errors. ?  ?Jene Every, MD ?06/18/21 1558 ? ?

## 2021-06-18 NOTE — Plan of Care (Signed)
  Problem: Education: Goal: Knowledge of General Education information [name] improve Description: Including pain rating scale, medication(s)/side effects and non-pharmacologic comfort measures Outcome: Progressing   Problem: Health Behavior/Discharge Planning: Goal: Ability to manage health-related needs [name] improve Outcome: Progressing   Problem: Clinical Measurements: Goal: Ability to maintain clinical measurements within normal limits [name] improve Outcome: Progressing Goal: [name] remain free from infection Outcome: Progressing Goal: Diagnostic test results [name] improve Outcome: Progressing Goal: Respiratory complications [name] improve Outcome: Progressing Goal: Cardiovascular complication [name] be avoided Outcome: Progressing   Problem: Activity: Goal: Risk for activity intolerance [name] decrease Outcome: Progressing   Problem: Nutrition: Goal: Adequate nutrition [name] be maintained Outcome: Progressing   Problem: Coping: Goal: Level of anxiety [name] decrease Outcome: Progressing   Problem: Elimination: Goal: [name] not experience complications related to bowel motility Outcome: Progressing Goal: [name] not experience complications related to urinary retention Outcome: Progressing   Problem: Pain Managment: Goal: General experience of comfort [name] improve Outcome: Progressing   Problem: Safety: Goal: Ability to remain free from injury [name] improve Outcome: Progressing   Problem: Skin Integrity: Goal: Risk for impaired skin integrity [name] decrease Outcome: Progressing   

## 2021-06-18 NOTE — ED Triage Notes (Signed)
Pt comes with c/o increased weakness, fatigue and constipation. Pt states abdominal pain. 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 Pt is extremely jaundiced. Pt states hx of alcohol abuse but no longer drinks.  ?

## 2021-06-18 NOTE — H&P (Addendum)
History and Physical    Anthony Torres UJW:[id number] DOB: May 18, 1980 DOA: 06/18/2021  PCP: Margaretann Loveless, MD (Confirm with patient/family/NH records and if not entered, this has to be entered at [facility] point of entry) Patient coming from: Home  I have personally briefly reviewed patient's old medical records in [facility]  Chief Complaint: Feeling weak  HPI: Anthony Torres is a 41 y.o. male with medical history significant of alcoholic hepatitis, cirrhosis secondary to alcohol abuse, anemia secondary to chronic liver disease, chronic hyponatremia, came with worsening of generalized weakness and exertional dyspnea.  Symptoms started 2 to 3 days ago, with whole day lethargic, exertional dyspnea and occasional lightheadedness.  He has been checking his weight every day and found his weight varies within 1 lb. he also noticed increasing swelling of bilateral ankles.  Denies any blood in the stool or black tarry stool.  He was recently hospitalized for alcoholic hepatitis and discharged home with steroid for 1 month, which his GI doctor stopped about 7-8 days ago.  He also complained about constipated for last 3 days and has had cramping-like periumbilical abdominal pain, episodic.  Denies any fever chills, no nauseous vomiting.  Patient reported that she has been sober for more than a month, has not yet set up follow-up with hepatologist to discuss transplant list.  ED Course: No hypotension no tachycardia afebrile.  Blood work sodium 125 compared to baseline 127-131, bicarb 17, BUN 37.  Review of Systems: As per HPI otherwise 14 point review of systems negative.    History reviewed. No pertinent past medical history.  History reviewed. No pertinent surgical history.   reports that he quit smoking about 4 years ago. His smoking use included cigarettes. He has never used smokeless tobacco. He reports current alcohol use. He reports that he does not use drugs.  No Known Allergies  No  family history on file.   Prior to Admission medications   Medication Sig Start Date End Date Taking? Authorizing Provider  calcium carbonate (TUMS - DOSED IN MG ELEMENTAL CALCIUM) 500 MG chewable tablet Chew 1 tablet (200 mg of elemental calcium total) by mouth 3 (three) times daily as needed for indigestion or heartburn. 05/31/21   Pennie Banter, DO  folic acid (FOLVITE) 1 MG tablet Take 1 tablet (1 mg total) by mouth daily. 06/01/21   Pennie Banter, DO  furosemide (LASIX) 40 MG tablet Take 1 tablet (40 mg total) by mouth daily. 05/31/21   Pennie Banter, DO  Multiple Vitamin (MULTIVITAMIN WITH MINERALS) TABS tablet Take 1 tablet by mouth daily. 06/01/21   Pennie Banter, DO  pantoprazole (PROTONIX) 40 MG tablet Take 1 tablet (40 mg total) by mouth daily for 14 days. 12/05/20 12/19/20  Shaune Pollack, MD  predniSONE (DELTASONE) 20 MG tablet Take 2 tablets (40 mg total) by mouth daily with breakfast for 28 days. 06/01/21 06/29/21  Pennie Banter, DO  spironolactone (ALDACTONE) 100 MG tablet Take 1 tablet (100 mg total) by mouth daily. 06/01/21   Pennie Banter, DO  thiamine 100 MG tablet Take 1 tablet (100 mg total) by mouth daily. 06/01/21   Pennie Banter, DO    Physical Exam: Vitals:   06/18/21 1059 06/18/21 1150  BP: (!) 117/51 120/63  Pulse: 94 89  Resp: 18 14  Temp: 98.2 F (36.8 C) 98.5 F (36.9 C)  SpO2: 100% 100%    Constitutional: NAD, calm, comfortable Vitals:   06/18/21 1059 06/18/21 1150  BP: (!) 117/51 120/63  Pulse: 94 89  Resp: 18 14  Temp: 98.2 F (36.8 C) 98.5 F (36.9 C)  SpO2: 100% 100%   Eyes: PERRL, conjunctive icterus ENMT: Mucous membranes are moist. Posterior pharynx clear of any exudate or lesions.Normal dentition.  Neck: normal, supple, no masses, no thyromegaly Respiratory: clear to auscultation bilaterally, no wheezing, no crackles. Normal respiratory effort. No accessory muscle use.  Cardiovascular: Regular rate and rhythm, no  murmurs / rubs / gallops. No extremity edema. 2+ pedal pulses. No carotid bruits.  Abdomen: Mild tenderness on periumbilical right, no rebound no guarding, no masses palpated. No hepatosplenomegaly. Bowel sounds positive.  Musculoskeletal: no clubbing / cyanosis. No joint deformity upper and lower extremities. Good ROM, no contractures. Normal muscle tone.  Skin: no rashes, lesions, ulcers. No induration Neurologic: CN 2-12 grossly intact. Sensation intact, DTR normal. Strength 5/5 in all 4.  Psychiatric: Normal judgment and insight. Alert and oriented x 3. Normal mood.     Labs on Admission: I have personally reviewed following labs and imaging studies  CBC: Recent Labs  Lab 06/18/21 1025  WBC 17.8*  HGB 7.2*  HCT 21.3*  MCV 105.4*  PLT 173   Basic Metabolic Panel: Recent Labs  Lab 06/18/21 1025  NA 125*  K 5.0  CL 98  CO2 17*  GLUCOSE 114*  BUN 37*  CREATININE UNABLE TO REPORT DUE TO ICTERUS  CALCIUM 8.1*   GFR: CrCl cannot be calculated (This lab value cannot be used to calculate CrCl because it is not a number: UNABLE TO REPORT DUE TO ICTERUS). Liver Function Tests: Recent Labs  Lab 06/18/21 1025  AST 134*  ALT 70*  ALKPHOS 272*  BILITOT 30.9*  PROT 7.0  ALBUMIN 2.1*   No results for input(s): LIPASE, AMYLASE in the last 168 hours. Recent Labs  Lab 06/18/21 1139  AMMONIA 35   Coagulation Profile: Recent Labs  Lab 06/18/21 1031  INR 1.4*   Cardiac Enzymes: No results for input(s): CKTOTAL, CKMB, CKMBINDEX, TROPONINI in the last 168 hours. BNP (last 3 results) No results for input(s): PROBNP in the last 8760 hours. HbA1C: No results for input(s): HGBA1C in the last 72 hours. CBG: No results for input(s): GLUCAP in the last 168 hours. Lipid Profile: No results for input(s): CHOL, HDL, LDLCALC, TRIG, CHOLHDL, LDLDIRECT in the last 72 hours. Thyroid Function Tests: No results for input(s): TSH, T4TOTAL, FREET4, T3FREE, THYROIDAB in the last 72  hours. Anemia Panel: No results for input(s): VITAMINB12, FOLATE, FERRITIN, TIBC, IRON, RETICCTPCT in the last 72 hours. Urine analysis:    Component Value Date/Time   COLORURINE AMBER (A) 06/18/2021 1025   APPEARANCEUR CLEAR (A) 06/18/2021 1025   LABSPEC 1.012 06/18/2021 1025   PHURINE 6.0 06/18/2021 1025   GLUCOSEU NEGATIVE 06/18/2021 1025   HGBUR NEGATIVE 06/18/2021 1025   BILIRUBINUR MODERATE (A) 06/18/2021 1025   KETONESUR NEGATIVE 06/18/2021 1025   PROTEINUR NEGATIVE 06/18/2021 1025   NITRITE NEGATIVE 06/18/2021 1025   LEUKOCYTESUR NEGATIVE 06/18/2021 1025    Radiological Exams on Admission: No results found.  EKG: Independently reviewed.  Sinus, no acute ST changes.  Assessment/Plan Principal Problem:   Symptomatic anemia Active Problems:   Alcoholic hepatitis   Hyperbilirubinemia  (please populate well all problems here in Problem List. (For example, if patient is on BP meds at home and you resume or decide to hold them, it is a problem that needs to be her. Same for CAD, COPD, HLD and so on)  Generalized weakness -First differential is symptomatic anemia given gradually trending down of hemoglobin.  Iron study was done recently showed compatible with anemia with chronic liver disease.  Ordered PRBC x1. -Other DDx, also has concurrent increasing leukocytosis, nausea/officiates and has been on steroid for alcoholic hepatitis, however infection cannot be ruled out, given there is also a new onset of abdominal pain, ordered IR guided paracentesis to rule out SBP.  Given there is no significant SIRS or sepsis, Timofey hold off antibiotics for now. -Other possibility, check TSH and VBG -He has chronic hyponatremia, 125 is a little worse than his baseline from 127-131 on last admission, less likely to be the major cause of his symptoms.  Hyponatremia -Acute on chronic, with signs of mild fluid overload, etiology likely from liver cirrhosis.  Increased diuresis as  above. -Discussed with patient about importance of fluid restriction at home of less than 2000 mL/day, patient expressed understanding.  Decompensated liver cirrhosis -Has chronic elevation of INR, hypoalbuminemia -Has increasing bilateral pitting edema, Ramesses increase Lasix from 40 mg daily p.o. to 40 mg IV twice daily and continue Aldactone. -Check DVT study. -Ammonium level normal, add lactulose twice daily given there is a new onset of constipation.  Subacute alcoholic hepatitis -Off p.o. steroid 7-8 days ago as patient was told "no significant improvement of liver profiles", outpatient follow-up with hematology, given there is no significant improvement of bilirubinemia and/or transaminitis, plan to increase treatment..  Chronic coagulopathy -Hold off DVT prophylaxis  Bilateral edema -Check DVT study  DVT prophylaxis: SCD Code Status: Full code Family Communication: None at bedside Disposition Plan: Expect less than 2 midnight hospital stay Consults called: None Admission status: MedSurg observation   Emeline General MD [facility] Pager [phone]  06/18/2021, 2:06 PM

## 2021-06-19 ENCOUNTER — Encounter: Payer: Self-pay | Admitting: Internal Medicine

## 2021-06-19 DIAGNOSIS — K7011 Alcoholic hepatitis with ascites: Secondary | ICD-10-CM | POA: Diagnosis not present

## 2021-06-19 DIAGNOSIS — E871 Hypo-osmolality and hyponatremia: Secondary | ICD-10-CM | POA: Diagnosis not present

## 2021-06-19 DIAGNOSIS — R140 Abdominal distension (gaseous): Secondary | ICD-10-CM | POA: Diagnosis not present

## 2021-06-19 DIAGNOSIS — D638 Anemia in other chronic diseases classified elsewhere: Secondary | ICD-10-CM

## 2021-06-19 DIAGNOSIS — K7460 Unspecified cirrhosis of liver: Secondary | ICD-10-CM

## 2021-06-19 DIAGNOSIS — R188 Other ascites: Secondary | ICD-10-CM

## 2021-06-19 DIAGNOSIS — D649 Anemia, unspecified: Secondary | ICD-10-CM | POA: Diagnosis not present

## 2021-06-19 LAB — TYPE AND SCREEN
ABO/RH(D): O POS
Antibody Screen: NEGATIVE
Unit division: 0

## 2021-06-19 LAB — COMPREHENSIVE METABOLIC PANEL WITH GFR
ALT: 61 U/L — ABNORMAL HIGH (ref 0–44)
AST: 103 U/L — ABNORMAL HIGH (ref 15–41)
Albumin: 2 g/dL — ABNORMAL LOW (ref 3.5–5.0)
Alkaline Phosphatase: 263 U/L — ABNORMAL HIGH (ref 38–126)
Anion gap: 7 (ref 5–15)
BUN: 31 mg/dL — ABNORMAL HIGH (ref 6–20)
CO2: 19 mmol/L — ABNORMAL LOW (ref 22–32)
Calcium: 8.4 mg/dL — ABNORMAL LOW (ref 8.9–10.3)
Chloride: 101 mmol/L (ref 98–111)
Creatinine, Ser: UNDETERMINED mg/dL (ref 0.61–1.24)
Glucose, Bld: 141 mg/dL — ABNORMAL HIGH (ref 70–99)
Potassium: 4.9 mmol/L (ref 3.5–5.1)
Sodium: 127 mmol/L — ABNORMAL LOW (ref 135–145)
Total Bilirubin: 28.7 mg/dL (ref 0.3–1.2)
Total Protein: 6.7 g/dL (ref 6.5–8.1)

## 2021-06-19 LAB — CBC
HCT: 23.5 % — ABNORMAL LOW (ref 39.0–52.0)
Hemoglobin: 8.2 g/dL — ABNORMAL LOW (ref 13.0–17.0)
MCH: 35 pg — ABNORMAL HIGH (ref 26.0–34.0)
MCHC: 34.9 g/dL (ref 30.0–36.0)
MCV: 100.4 fL — ABNORMAL HIGH (ref 80.0–100.0)
Platelets: 144 K/uL — ABNORMAL LOW (ref 150–400)
RBC: 2.34 MIL/uL — ABNORMAL LOW (ref 4.22–5.81)
RDW: 13.7 % (ref 11.5–15.5)
WBC: 14.1 K/uL — ABNORMAL HIGH (ref 4.0–10.5)
nRBC: 0 % (ref 0.0–0.2)

## 2021-06-19 LAB — BPAM RBC
Blood Product Expiration Date: 202306142359
ISSUE DATE / TIME: 202305091607
Unit Type and Rh: 5100

## 2021-06-19 MED ORDER — FUROSEMIDE 40 MG PO TABS: 40.0000 mg | ORAL_TABLET | Freq: Two times a day (BID) | ORAL | 0 refills | Status: DC

## 2021-06-19 MED ORDER — LACTULOSE 10 GM/15ML PO SOLN: 30.0000 g | Freq: Two times a day (BID) | ORAL | 0 refills | Status: DC

## 2021-06-19 MED ORDER — PREDNISONE 20 MG PO TABS
40.0000 mg | ORAL_TABLET | Freq: Every day | ORAL | 0 refills | Status: AC
Start: 2021-06-19 — End: 2021-06-29

## 2021-06-19 NOTE — TOC CM/SW Note (Signed)
Patient has orders to discharge home today. Chart reviewed. PCP is Yves Dill, MD. On room air. No wound care needs. No TOC needs identified. CSW signing off. ? ?Charlynn Court, CSW ?[phone] ? ?

## 2021-06-19 NOTE — Discharge Summary (Signed)
Physician Discharge Summary  ?Anthony Torres ZOX:[id number] DOB: 05/22/1980 DOA: 06/18/2021 ? ?PCP: Anthony LovelessKhan, Neelam S, MD ? ?Admit date: 06/18/2021 ?Discharge date: 06/19/2021 ? ?Admitted From: home ?Disposition:  home ? ?Recommendations for Outpatient Follow-up:  ?Follow up with PCP in 1-2 weeks - monitor in increased dose Lasix, new start lactulose ?Please obtain labs/tests: CMP in 1 week to follow LFT and chronic hyponatremia  ?Please follow up on the following pending results: none ? ?Home Health: none  ?Equipment/Devices: none ? ?Discharge Condition: good  ?CODE STATUS: FULL  ?Diet recommendation:  ?Diet Orders (From admission, onward)  ? ?  Start     Ordered  ? 06/19/21 0000  Diet - low sodium heart healthy       ? 06/19/21 1133  ? ?  ?  ? ?  ? ? ? ?Brief/Interim Summary: ? ?Anthony Torres is a 41 y.o. male with medical history significant of alcoholic hepatitis, cirrhosis secondary to alcohol abuse, anemia secondary to chronic liver disease, chronic hyponatremia, came with worsening of generalized weakness and exertional dyspnea. ?  ?Symptoms started 2 to 3 days prior to ED presentation, with whole day lethargic, exertional dyspnea and occasional lightheadedness.  He had been checking his weight every day and found his weight varies within 1 lb. he also noticed increasing swelling of bilateral ankles.  Denied any blood in the stool or black tarry stool. ?  ?He was recently hospitalized for alcoholic hepatitis and discharged home with steroid for 1 month, which his GI doctor stopped about 7-8 days ago.  He also complained about constipated for last 3 days and has had cramping-like periumbilical abdominal pain, episodic.  Denied any fever chills, no nauseous vomiting. ?  ?Patient reported that she has been sober for more than a month, has not yet set up follow-up with hepatologist to discuss transplant list. ? ?Hgb:  on 06/18/21 in ED was 7.2 --(1 unit PRBC)--> 8.6 --> 8.2 this AM 06/19/21 ? ? ?Consultants:   ?none ? ?Procedures:  ?none ? ? ? ? ? ?Discharge Diagnoses: ?Principal Problem: ?  Symptomatic anemia ?Active Problems: ?  Alcoholic hepatitis ?  Hyperbilirubinemia ? ? ? ?Assessment & Plan: ? ?Generalized weakness ?-First differential is symptomatic anemia given gradually trending down of hemoglobin.  Iron study was done recently showed compatible with anemia with chronic liver disease.  Ordered PRBC x1. ?-No significant SIRS or sepsis, Anthony Torres hold off antibiotics for now. ?-Other possibility, check TSH and VBG ?-He has chronic hyponatremia, 125 is a little worse than his baseline from 127-131 on last admission, less likely to be the major cause of his symptoms. ?  ?Hyponatremia ?-Acute on chronic, with signs of mild fluid overload, etiology likely from liver cirrhosis.  Increased diuresis as above. ?-Discussed with patient about importance of fluid restriction at home of less than 2000 mL/day, patient expressed understanding. ?  ?Decompensated liver cirrhosis ?-chronic elevation of INR, hypoalbuminemia ?-had increasing bilateral pitting edema, increased Lasix from 40 mg daily p.o. to 40 mg IV twice daily and continue Aldactone. Diuresed, increased home Lasix to 40 mg bid to follow w/ outpatinent ?-DVT study was negative ?-Ammonium level normal, added lactulose twice daily given there is a new onset of constipation. ?Steroids were on med rec - I double checked notes and admitting dr had spoken to GI and states NO steroids needed, I called patient 06/19/21 5:47 PM at home and let him know he did not need to take the steroids.  ?  ?Subacute alcoholic hepatitis ?-  Off p.o. steroid 7-8 days ago as patient was told "no significant improvement of liver profiles", outpatient follow-up with hepatology ?  ?Chronic coagulopathy ?-Held off DVT prophylaxis ?  ? ? ? ? ? ? ?Discharge Instructions ? ?Discharge Instructions   ? ? Diet - low sodium heart healthy   Complete by: As directed ?  ? Discharge instructions   Complete by: As  directed ?  ? Keep appointment with GI  ?Please schedule a visit with your PCP within 1 week to recheck blood counts  ? Increase activity slowly   Complete by: As directed ?  ? ?  ? ?Allergies as of 06/19/2021   ?No Known Allergies ?  ? ?  ?Medication List  ?  ? ?TAKE these medications   ? ?calcium carbonate 500 MG chewable tablet ?Commonly known as: TUMS - dosed in mg elemental calcium ?Chew 1 tablet (200 mg of elemental calcium total) by mouth 3 (three) times daily as needed for indigestion or heartburn. ?  ?folic acid 1 MG tablet ?Commonly known as: FOLVITE ?Take 1 tablet (1 mg total) by mouth daily. ?  ?furosemide 40 MG tablet ?Commonly known as: LASIX ?Take 1 tablet (40 mg total) by mouth 2 (two) times daily. ?What changed: when to take this ?  ?lactulose 10 GM/15ML solution ?Commonly known as: CHRONULAC ?Take 45 mLs (30 g total) by mouth 2 (two) times daily for 21 days. ?  ?multivitamin with minerals Tabs tablet ?Take 1 tablet by mouth daily. ?  ?omeprazole 20 MG tablet ?Commonly known as: PRILOSEC OTC ?Take 20 mg by mouth daily. ?  ?pantoprazole 40 MG tablet ?Commonly known as: Protonix ?Take 1 tablet (40 mg total) by mouth daily for 14 days. ?  ?predniSONE 20 MG tablet ?Commonly known as: DELTASONE ?Take 2 tablets (40 mg total) by mouth daily with breakfast for 10 days. Follow up with gastroenterology to decide to continue / stop this medicine ?What changed: additional instructions ?  ?spironolactone 100 MG tablet ?Commonly known as: ALDACTONE ?Take 1 tablet (100 mg total) by mouth daily. ?  ?thiamine 100 MG tablet ?Take 1 tablet (100 mg total) by mouth daily. ?  ? ?  ? ?Diet Orders (From admission, onward)  ? ?  Start     Ordered  ? 06/19/21 0000  Diet - low sodium heart healthy       ? 06/19/21 1133  ? ?  ?  ? ?  ? ? ? ? ?No Known Allergies ? ? ?Subjective: patient seen and examined resting in bed, no apparent distress. Reports fatigue but otherwise no concerns. Requesting few days off work. Would like to  go home if possible  ? ? ?Discharge Exam: ?Vitals:  ? 06/19/21 0541 06/19/21 0824  ?BP: (!) 101/54 (!) 99/51  ?Pulse: 93 79  ?Resp: 18 16  ?Temp: 98 ?F (36.7 ?C) 98.1 ?F (36.7 ?C)  ?SpO2:  100%  ? ?Vitals:  ? 06/19/21 0506 06/19/21 9150 06/19/21 0541 06/19/21 0824  ?BP: (!) 113/55 (!) 101/54 (!) 101/54 (!) 99/51  ?Pulse: 89 93 93 79  ?Resp:   18 16  ?Temp:   98 ?F (36.7 ?C) 98.1 ?F (36.7 ?C)  ?TempSrc:   Oral   ?SpO2: 100% 93%  100%  ?Weight:   98.4 kg   ?Height:   5\' 10"  (1.778 m)   ? ? ? ?General: Pt is alert, awake, not in acute distress. Jaundiced.  ?Cardiovascular: RRR, S1/S2 +, no rubs, no gallops ?Respiratory: CTA bilaterally,  no wheezing, no rhonchi ?Abdominal: Soft, NT, ND, bowel sounds + ?Extremities: no edema, no cyanosis ? ? ? ? ?The results of significant diagnostics from this hospitalization (including imaging, microbiology, ancillary and laboratory) are listed below for reference.   ? ? ?Microbiology: ?No results found for this or any previous visit (from the past 240 hour(Torres)).  ? ?Labs: ?BNP (last 3 results) ?No results for input(Torres): BNP in the last 8760 hours. ?Basic Metabolic Panel: ?Recent Labs  ?Lab 06/18/21 ?1025 06/19/21 ?0332  ?NA 125* 127*  ?K 5.0 4.9  ?CL 98 101  ?CO2 17* 19*  ?GLUCOSE 114* 141*  ?BUN 37* 31*  ?CREATININE UNABLE TO REPORT DUE TO ICTERUS UNABLE TO REPORT DUE TO ICTERUS  ?CALCIUM 8.1* 8.4*  ? ?Liver Function Tests: ?Recent Labs  ?Lab 06/18/21 ?1025 06/19/21 ?0332  ?AST 134* 103*  ?ALT 70* 61*  ?ALKPHOS 272* 263*  ?BILITOT 30.9* 28.7*  ?PROT 7.0 6.7  ?ALBUMIN 2.1* 2.0*  ? ?No results for input(Torres): LIPASE, AMYLASE in the last 168 hours. ?Recent Labs  ?Lab 06/18/21 ?1139  ?AMMONIA 35  ? ?CBC: ?Recent Labs  ?Lab 06/18/21 ?1025 06/18/21 ?2142 06/19/21 ?0332  ?WBC 17.8*  --  14.1*  ?HGB 7.2* 8.6* 8.2*  ?HCT 21.3* 24.7* 23.5*  ?MCV 105.4*  --  100.4*  ?PLT 173  --  144*  ? ?Cardiac Enzymes: ?No results for input(Torres): CKTOTAL, CKMB, CKMBINDEX, TROPONINI in the last 168  hours. ?BNP: ?Invalid input(Torres): POCBNP ?CBG: ?Recent Labs  ?Lab 06/18/21 ?2059  ?GLUCAP 150*  ? ?D-Dimer ?No results for input(Torres): DDIMER in the last 72 hours. ?Hgb A1c ?No results for input(Torres): HGBA1C in the last 72 hours. ?Lipi

## 2021-06-19 NOTE — Progress Notes (Signed)
Pre-rounds chart review and preliminary plan / goals for today: ?Significant overnight events per chart: none ?Significant new findings on labs/other diagnostics:  ?Hgb:  on 06/18/21 in ED was 7.2 --(1 unit PRBC)--> 8.6 --> 8.2 this AM 06/19/21  ?Hyponatremia slight improved, at baseline  ?Dispo plan: home ?Pending/Plan:  ?May be able to d/c today pending rounds ? ?Please feel free to reach out via secure chat in Epic for non-urgent issues. Please page for urgent matters! ? ?This note Tico be updated after rounds to full progress note / discharge note.  ? ?

## 2021-07-01 ENCOUNTER — Encounter: Payer: Self-pay | Admitting: Gastroenterology

## 2021-07-01 ENCOUNTER — Encounter
Admission: RE | Disposition: A | Discharge status: Home / Self Care | Payer: Self-pay | Source: Home / Self Care | Attending: Gastroenterology

## 2021-07-01 ENCOUNTER — Ambulatory Visit: Payer: BC Managed Care – PPO | Admitting: Anesthesiology

## 2021-07-01 ENCOUNTER — Ambulatory Visit
Admission: RE | Admit: 2021-07-01 | Discharge: 2021-07-01 | Disposition: A | Discharge status: Home / Self Care | Payer: BC Managed Care – PPO | Attending: Gastroenterology | Admitting: Gastroenterology

## 2021-07-01 DIAGNOSIS — K766 Portal hypertension: Secondary | ICD-10-CM | POA: Diagnosis not present

## 2021-07-01 DIAGNOSIS — I8510 Secondary esophageal varices without bleeding: Secondary | ICD-10-CM | POA: Diagnosis not present

## 2021-07-01 DIAGNOSIS — R17 Unspecified jaundice: Secondary | ICD-10-CM | POA: Insufficient documentation

## 2021-07-01 DIAGNOSIS — R011 Cardiac murmur, unspecified: Secondary | ICD-10-CM | POA: Diagnosis not present

## 2021-07-01 DIAGNOSIS — K7010 Alcoholic hepatitis without ascites: Secondary | ICD-10-CM | POA: Insufficient documentation

## 2021-07-01 DIAGNOSIS — K31811 Angiodysplasia of stomach and duodenum with bleeding: Secondary | ICD-10-CM | POA: Insufficient documentation

## 2021-07-01 DIAGNOSIS — K3189 Other diseases of stomach and duodenum: Secondary | ICD-10-CM | POA: Diagnosis not present

## 2021-07-01 DIAGNOSIS — K7030 Alcoholic cirrhosis of liver without ascites: Secondary | ICD-10-CM | POA: Insufficient documentation

## 2021-07-01 DIAGNOSIS — Z87891 Personal history of nicotine dependence: Secondary | ICD-10-CM | POA: Diagnosis not present

## 2021-07-01 DIAGNOSIS — J449 Chronic obstructive pulmonary disease, unspecified: Secondary | ICD-10-CM | POA: Diagnosis not present

## 2021-07-01 DIAGNOSIS — R600 Localized edema: Secondary | ICD-10-CM | POA: Insufficient documentation

## 2021-07-01 DIAGNOSIS — I864 Gastric varices: Secondary | ICD-10-CM | POA: Insufficient documentation

## 2021-07-01 DIAGNOSIS — Z970 Presence of artificial eye: Secondary | ICD-10-CM | POA: Diagnosis not present

## 2021-07-01 DIAGNOSIS — D539 Nutritional anemia, unspecified: Secondary | ICD-10-CM | POA: Insufficient documentation

## 2021-07-01 HISTORY — PX: ESOPHAGOGASTRODUODENOSCOPY: SHX5428

## 2021-07-01 SURGERY — EGD (ESOPHAGOGASTRODUODENOSCOPY)
Anesthesia: General

## 2021-07-01 MED ORDER — PHENYLEPHRINE 80 MCG/ML (10ML) SYRINGE FOR IV PUSH (FOR BLOOD PRESSURE SUPPORT)
PREFILLED_SYRINGE | INTRAVENOUS | Status: AC
  Filled 2021-07-01: qty 10

## 2021-07-01 MED ORDER — PROPOFOL 500 MG/50ML IV EMUL
INTRAVENOUS | Status: AC
  Filled 2021-07-01: qty 50

## 2021-07-01 MED ORDER — SODIUM CHLORIDE 0.9 % IV SOLN
INTRAVENOUS | Status: DC
  Administered 2021-07-01: 20 mL/h via INTRAVENOUS

## 2021-07-01 MED ORDER — LIDOCAINE HCL (PF) 2 % IJ SOLN
INTRAMUSCULAR | Status: AC
  Filled 2021-07-01: qty 5

## 2021-07-01 MED ORDER — PHENYLEPHRINE HCL (PRESSORS) 10 MG/ML IV SOLN
INTRAVENOUS | Status: DC | PRN
  Administered 2021-07-01 (×3): 80 ug via INTRAVENOUS

## 2021-07-01 MED ORDER — LIDOCAINE HCL (CARDIAC) PF 100 MG/5ML IV SOSY
PREFILLED_SYRINGE | INTRAVENOUS | Status: DC | PRN
  Administered 2021-07-01: 50 mg via INTRAVENOUS

## 2021-07-01 MED ORDER — PROPOFOL 500 MG/50ML IV EMUL
INTRAVENOUS | Status: DC | PRN
  Administered 2021-07-01: 150 ug/kg/min via INTRAVENOUS

## 2021-07-01 NOTE — Interval H&P Note (Signed)
History and Physical Interval Note: Preprocedure H&P from 07/01/21  was reviewed and there was no interval change after seeing and examining the patient.  Written consent was obtained from the patient after discussion of risks, benefits, and alternatives. Patient has consented to proceed with Esophagogastroduodenoscopy with possible intervention   07/01/2021 12:29 PM  Anthony Torres  has presented today for surgery, with the diagnosis of Alcoholic hepatitis without ascites (123XX123) Alcoholic cirrhosis of liver without ascites (CMS-HCC) (K70.30) Abnormal LFTs (liver function tests) (R79.89) Symptomatic anemia, unspecified (D64.9) Portal hypertensive gastropathy (CMS-HCC) (K76.6,K31.89).  The various methods of treatment have been discussed with the patient and family. After consideration of risks, benefits and other options for treatment, the patient has consented to  Procedure(s): ESOPHAGOGASTRODUODENOSCOPY (EGD) (N/A) as a surgical intervention.  The patient's history has been reviewed, patient examined, no change in status, stable for surgery.  I have reviewed the patient's chart and labs.  Questions were answered to the patient's satisfaction.     Annamaria Helling

## 2021-07-01 NOTE — Op Note (Signed)
[facility] Gastroenterology Patient Name: Anthony Torres Procedure Date: 07/01/2021 11:42 AM MRN: [id number] Account #: [id number] Date of Birth: 03-31-1980 Admit Type: Outpatient Age: 41 Room: [facility] ENDO ROOM 2 Gender: Male Note Status: Supervisor Override Instrument Name: Altamese Cabal Endoscope [id number] Procedure:             Upper GI endoscopy Indications:           Portal hypertensive gastropathy, Anemia, Alcoholic                         cirrhosis, Alcoholic hepatitis Providers:             Annamaria Helling DO, DO Medicines:             Monitored Anesthesia Care Complications:         No immediate complications. Estimated blood loss:                         Minimal. Procedure:             Pre-Anesthesia Assessment:                        - Prior to the procedure, a History and Physical was                         performed, and patient medications and allergies were                         reviewed. The patient is competent. The risks and                         benefits of the procedure and the sedation options and                         risks were discussed with the patient. All questions                         were answered and informed consent was obtained.                         Patient identification and proposed procedure were                         verified by the physician, the nurse, the anesthetist                         and the technician in the endoscopy suite. Mental                         Status Examination: alert and oriented. Airway                         Examination: normal oropharyngeal airway and neck                         mobility. Respiratory Examination: clear to                         auscultation. CV Examination: systolic murmur.  Prophylactic Antibiotics: The patient does not require                         prophylactic antibiotics. Prior Anticoagulants: The                         patient has taken no  previous anticoagulant or                         antiplatelet agents. ASA Grade Assessment: III - A                         patient with severe systemic disease. After reviewing                         the risks and benefits, the patient was deemed in                         satisfactory condition to undergo the procedure. The                         anesthesia plan was to use monitored anesthesia care                         (MAC). Immediately prior to administration of                         medications, the patient was re-assessed for adequacy                         to receive sedatives. The heart rate, respiratory                         rate, oxygen saturations, blood pressure, adequacy of                         pulmonary ventilation, and response to care were                         monitored throughout the procedure. The physical                         status of the patient was re-assessed after the                         procedure.                        After obtaining informed consent, the endoscope was                         passed under direct vision. Throughout the procedure,                         the patient's blood pressure, pulse, and oxygen                         saturations were monitored continuously. The Endoscope  was introduced through the mouth, and advanced to the                         second part of duodenum. The upper GI endoscopy was                         accomplished without difficulty. The patient tolerated                         the procedure well. Findings:      Three less than 1 mm angioectasias with stigmata of recent bleeding were       found in the second portion of the duodenum. Coagulation for hemostasis       using argon plasma was successful. Estimated blood loss was minimal.      The ampulla, duodenal bulb and first portion of the duodenum were       normal. Estimated blood loss: none.      Severe portal  hypertensive gastropathy was found in the entire examined       stomach. Estimated blood loss: none.      Type 1 isolated gastric varices (IGV1, varices located in the fundus)       with no bleeding were found in the cardia. There were no stigmata of       recent bleeding. Estimated blood loss: none.      The Z-line was variable. Estimated blood loss: none.      Esophagogastric landmarks were identified: the gastroesophageal junction       was found at 36 cm from the incisors.      Grade I varices were found in the lower third of the esophagus.       Estimated blood loss: none.      Diffuse Yellowing mucosal changes were found in the entire esophagus.       Estimated blood loss: none. Impression:            - Three recently bleeding angioectasias in the                         duodenum. Treated with argon plasma coagulation (APC).                        - Normal ampulla, duodenal bulb and first portion of                         the duodenum.                        - Portal hypertensive gastropathy.                        - Type 1 isolated gastric varices (IGV1, varices                         located in the fundus), without bleeding.                        - Z-line variable.                        - Esophagogastric landmarks identified.                        -  Grade I esophageal varices.                        - Mucosal changes in the esophagus.                        - No specimens collected. Recommendation:        - Discharge patient to home.                        - Resume previous diet.                        - Continue present medications.                        - Repeat upper endoscopy in 6 months for retreatment.                        - Return to GI office as previously scheduled.                        - Referral to Hepatology (Liver doctor) in place                        - The findings and recommendations were discussed with                         the patient.                         - The findings and recommendations were discussed with                         the patient's family. Procedure Code(s):     --- Professional ---                        [phone], Esophagogastroduodenoscopy, flexible,                         transoral; with control of bleeding, any method Diagnosis Code(s):     --- Professional ---                        K74.259, Angiodysplasia of stomach and duodenum with                         bleeding                        K76.6, Portal hypertension                        K31.89, Other diseases of stomach and duodenum                        I86.4, Gastric varices                        K22.8, Other specified diseases of esophagus  I85.00, Esophageal varices without bleeding CPT copyright 2019 American [employer] Association. All rights reserved. The codes documented in this report are preliminary and upon coder review may  be revised to meet current compliance requirements. Attending Participation:      I personally performed the entire procedure. Volney American, DO Annamaria Helling DO, DO 07/01/2021 1:03:42 PM This report has been signed electronically. Number of Addenda: 0 Note Initiated On: 07/01/2021 11:42 AM Estimated Blood Loss:  Estimated blood loss was minimal.      [facility]

## 2021-07-01 NOTE — Anesthesia Postprocedure Evaluation (Signed)
Anesthesia Post Note  Patient: Anthony Torres  Procedure(s) Performed: ESOPHAGOGASTRODUODENOSCOPY (EGD)  Patient location during evaluation: PACU Anesthesia Type: General Level of consciousness: sedated Pain management: satisfactory to patient Vital Signs Assessment: post-procedure vital signs reviewed and stable Respiratory status: spontaneous breathing and respiratory function stable Cardiovascular status: stable Anesthetic complications: no   No notable events documented.   Last Vitals:  Vitals:   07/01/21 1320 07/01/21 1330  BP: (!) 89/39 (!) 93/49  Pulse: 84 85  Resp: 20 18  Temp:    SpO2: 98% 96%    Last Pain:  Vitals:   07/01/21 1330  TempSrc:   PainSc: 0-No pain                 VAN STAVEREN,Devlyn Parish

## 2021-07-01 NOTE — H&P (Signed)
Pre-Procedure H&P   Patient ID: Anthony Torres is a 41 y.o. male.  Gastroenterology Provider: [name], DO  Referring Provider: Jacob Moores, PA PCP: Margaretann Loveless, MD  Date: 07/01/2021  HPI [name] is a 41 y.o. male who presents today for Esophagogastroduodenoscopy for anemia, cirrhosis. Pt with cirrhosis who is being evaluated for continued macrocytic anemia. Pt with etoh hepatitis and etoh induced cirrhosis who was seen in the hospital with worsening hgb down to 7.2. he improved to 8.9 with 1 uprbc. MCV is >100, plt 242K Bun 47, Cr 2.3. INR 1.4. He has not noted any melena or hematochezia. He is on folic acid supplementation.  PMHX: Cirrhosis Alcohol abuse  History reviewed. No pertinent surgical history.  Family History No h/o GI disease or malignancy  Review of Systems  Constitutional:  Positive for fatigue. Negative for activity change, appetite change, chills, diaphoresis, fever and unexpected weight change.  HENT:  Negative for trouble swallowing and voice change.   Respiratory:  Positive for shortness of breath. Negative for wheezing.   Cardiovascular:  Positive for leg swelling. Negative for chest pain and palpitations.  Gastrointestinal:  Positive for abdominal distention. Negative for abdominal pain, anal bleeding, blood in stool, constipation, diarrhea, nausea and vomiting.  Musculoskeletal:  Negative for arthralgias and myalgias.  Skin:  Positive for color change. Negative for pallor.  Neurological:  Positive for weakness. Negative for dizziness and syncope.  Psychiatric/Behavioral:  Negative for confusion. The patient is not nervous/anxious.   All other systems reviewed and are negative.   Medications No current facility-administered medications on file prior to encounter.   Current Outpatient Medications on File Prior to Encounter  Medication Sig Dispense Refill   folic acid (FOLVITE) 1 MG tablet Take 1 tablet (1 mg total) by mouth  daily. 30 tablet 2   furosemide (LASIX) 40 MG tablet Take 1 tablet (40 mg total) by mouth 2 (two) times daily. 60 tablet 0   lactulose (CHRONULAC) 10 GM/15ML solution Take 45 mLs (30 g total) by mouth 2 (two) times daily for 21 days. 1892 mL 0   Multiple Vitamin (MULTIVITAMIN WITH MINERALS) TABS tablet Take 1 tablet by mouth daily.     omeprazole (PRILOSEC OTC) 20 MG tablet Take 20 mg by mouth daily.     spironolactone (ALDACTONE) 100 MG tablet Take 1 tablet (100 mg total) by mouth daily. 30 tablet 2   thiamine 100 MG tablet Take 1 tablet (100 mg total) by mouth daily. 30 tablet 2   calcium carbonate (TUMS - DOSED IN MG ELEMENTAL CALCIUM) 500 MG chewable tablet Chew 1 tablet (200 mg of elemental calcium total) by mouth 3 (three) times daily as needed for indigestion or heartburn. (Patient not taking: Reported on 06/18/2021)     pantoprazole (PROTONIX) 40 MG tablet Take 1 tablet (40 mg total) by mouth daily for 14 days. 14 tablet 0    Pertinent medications related to GI and procedure were reviewed by me with the patient prior to the procedure   Current Facility-Administered Medications:    0.9 %  sodium chloride infusion, , Intravenous, Continuous, [name], DO      No Known Allergies Allergies were reviewed by me prior to the procedure  Objective   Body mass index is 30.85 kg/m. Vitals:   07/01/21 1202  BP: (!) 94/39  Pulse: 86  Resp: 20  Temp: 97.6 F (36.4 C)  TempSrc: Temporal  SpO2: 99%  Weight: 97.5 kg  Height: 5\' 10"  (1.778 m)     Physical Exam Vitals and nursing note reviewed.  Constitutional:      General: He is not in acute distress.    Appearance: He is ill-appearing. He is not toxic-appearing or diaphoretic.  HENT:     Head: Normocephalic and atraumatic.     Nose: Nose normal.     Mouth/Throat:     Mouth: Mucous membranes are moist.     Pharynx: Oropharynx is clear.  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      Extraocular Movements: Extraocular movements intact.  Cardiovascular:     Rate and Rhythm: Normal rate and regular rhythm.     Heart sounds: Murmur heard.    No friction rub. No gallop.  Pulmonary:     Effort: Pulmonary effort is normal. No respiratory distress.     Breath sounds: Normal breath sounds. No wheezing, rhonchi or rales.  Abdominal:     General: Bowel sounds are normal. There is distension.     Palpations: Abdomen is soft.     Tenderness: There is no abdominal tenderness. There is no guarding or rebound.  Musculoskeletal:     Cervical back: Neck supple.     Right lower leg: Edema present.     Left lower leg: Edema present.  Skin:    General: Skin is warm and dry.     Coloration: Skin is jaundiced. Skin is not pale.  Neurological:     General: No focal deficit present.     Mental Status: He is alert and oriented to person, place, and time. Mental status is at baseline.  Psychiatric:        Mood and Affect: Mood normal.        Behavior: Behavior normal.        Thought Content: Thought content normal.        Judgment: Judgment normal.     Assessment:  [name] is a 40 y.o. male  who presents today for Esophagogastroduodenoscopy for anemia, cirrhosis.  Plan:  Esophagogastroduodenoscopy with possible intervention today  Esophagogastroduodenoscopy with possible biopsy, control of bleeding, polypectomy, and interventions as necessary has been discussed with the patient/patient representative. Informed consent was obtained from the patient/patient representative after explaining the indication, nature, and risks of the procedure including but not limited to death, bleeding, perforation, missed neoplasm/lesions, cardiorespiratory compromise, and reaction to medications. Opportunity for questions was given and appropriate answers were provided. Patient/patient representative has verbalized understanding is amenable to undergoing the procedure.   24, DO  [facility] Gastroenterology  Portions of the record may have been created with voice recognition software. Occasional wrong-word or 'sound-a-like' substitutions may have occurred due to the inherent limitations of voice recognition software.  Read the chart carefully and recognize, using context, where substitutions may have occurred.

## 2021-07-01 NOTE — Anesthesia Procedure Notes (Signed)
Date/Time: 07/01/2021 12:46 PM Performed by: Vaughan Sine Pre-anesthesia Checklist: Patient identified, Emergency Drugs available, Suction available, Patient being monitored and Timeout performed Patient Re-evaluated:Patient Re-evaluated prior to induction Oxygen Delivery Method: Simple face mask Preoxygenation: Pre-oxygenation with 100% oxygen Induction Type: IV induction Airway Equipment and Method: Bite block Placement Confirmation: positive ETCO2 and CO2 detector

## 2021-07-01 NOTE — Transfer of Care (Signed)
Immediate Anesthesia Transfer of Care Note  Patient: Chick H Hedlund  Procedure(s) Performed: ESOPHAGOGASTRODUODENOSCOPY (EGD)  Patient Location: PACU  Anesthesia Type:General  Level of Consciousness: awake and sedated  Airway & Oxygen Therapy: Patient Spontanous Breathing and Patient connected to face mask oxygen  Post-op Assessment: Report given to RN and Post -op Vital signs reviewed and stable  Post vital signs: Reviewed and stable  Last Vitals:  Vitals Value Taken Time  BP    Temp    Pulse    Resp    SpO2      Last Pain:  Vitals:   07/01/21 1202  TempSrc: Temporal  PainSc: 0-No pain         Complications: No notable events documented.

## 2021-07-01 NOTE — Anesthesia Preprocedure Evaluation (Signed)
Anesthesia Evaluation  Patient identified by MRN, date of birth, ID band Patient awake    Airway Mallampati: III  TM Distance: <3 FB   Mouth opening: Limited Mouth Opening  Dental  (+) Poor Dentition,    Pulmonary asthma , COPD, former smoker,     + decreased breath sounds      Cardiovascular Exercise Tolerance: Poor  Rhythm:Regular     Neuro/Psych negative psych ROS   GI/Hepatic negative GI ROS, (+)     substance abuse  alcohol use, Hepatitis -, Toxin Related  Endo/Other  negative endocrine ROS  Renal/GU negative Renal ROS     Musculoskeletal   Abdominal Normal abdominal exam  (+)   Peds negative pediatric ROS (+)  Hematology negative hematology ROS (+)   Anesthesia Other Findings   Reproductive/Obstetrics                             Anesthesia Physical Anesthesia Plan  ASA: 3  Anesthesia Plan: General   Post-op Pain Management:    Induction: Intravenous  PONV Risk Score and Plan:   Airway Management Planned: Natural Airway and Nasal Cannula  Additional Equipment:   Intra-op Plan:   Post-operative Plan:   Informed Consent: I have reviewed the patients History and Physical, chart, labs and discussed the procedure including the risks, benefits and alternatives for the proposed anesthesia with the patient or authorized representative who has indicated his/her understanding and acceptance.       Plan Discussed with: CRNA and Surgeon  Anesthesia Plan Comments:         Anesthesia Quick Evaluation

## 2021-07-02 ENCOUNTER — Encounter: Payer: Self-pay | Admitting: Gastroenterology

## 2021-07-08 ENCOUNTER — Inpatient Hospital Stay: Payer: BC Managed Care – PPO

## 2021-07-08 ENCOUNTER — Inpatient Hospital Stay
Admission: EM | Admit: 2021-07-08 | Discharge: 2021-07-09 | DRG: 871 | Disposition: A | Discharge status: Other Acute Inpatient Hospital | Payer: BC Managed Care – PPO | Attending: Pulmonary Disease | Admitting: Pulmonary Disease

## 2021-07-08 ENCOUNTER — Encounter: Payer: Self-pay | Admitting: Emergency Medicine

## 2021-07-08 ENCOUNTER — Other Ambulatory Visit: Payer: Self-pay

## 2021-07-08 ENCOUNTER — Emergency Department: Payer: BC Managed Care – PPO

## 2021-07-08 DIAGNOSIS — Z20822 Contact with and (suspected) exposure to covid-19: Secondary | ICD-10-CM | POA: Diagnosis present

## 2021-07-08 DIAGNOSIS — R531 Weakness: Secondary | ICD-10-CM | POA: Diagnosis present

## 2021-07-08 DIAGNOSIS — F1010 Alcohol abuse, uncomplicated: Secondary | ICD-10-CM | POA: Diagnosis present

## 2021-07-08 DIAGNOSIS — D6959 Other secondary thrombocytopenia: Secondary | ICD-10-CM | POA: Diagnosis present

## 2021-07-08 DIAGNOSIS — J129 Viral pneumonia, unspecified: Secondary | ICD-10-CM | POA: Diagnosis present

## 2021-07-08 DIAGNOSIS — K8000 Calculus of gallbladder with acute cholecystitis without obstruction: Secondary | ICD-10-CM | POA: Diagnosis present

## 2021-07-08 DIAGNOSIS — E869 Volume depletion, unspecified: Secondary | ICD-10-CM | POA: Diagnosis present

## 2021-07-08 DIAGNOSIS — R6520 Severe sepsis without septic shock: Secondary | ICD-10-CM | POA: Diagnosis present

## 2021-07-08 DIAGNOSIS — E872 Acidosis, unspecified: Secondary | ICD-10-CM | POA: Diagnosis present

## 2021-07-08 DIAGNOSIS — A419 Sepsis, unspecified organism: Secondary | ICD-10-CM | POA: Diagnosis present

## 2021-07-08 DIAGNOSIS — K7031 Alcoholic cirrhosis of liver with ascites: Secondary | ICD-10-CM | POA: Diagnosis present

## 2021-07-08 DIAGNOSIS — E8809 Other disorders of plasma-protein metabolism, not elsewhere classified: Secondary | ICD-10-CM | POA: Diagnosis present

## 2021-07-08 DIAGNOSIS — N179 Acute kidney failure, unspecified: Secondary | ICD-10-CM | POA: Diagnosis present

## 2021-07-08 DIAGNOSIS — R6521 Severe sepsis with septic shock: Secondary | ICD-10-CM | POA: Diagnosis present

## 2021-07-08 DIAGNOSIS — K7589 Other specified inflammatory liver diseases: Secondary | ICD-10-CM | POA: Diagnosis present

## 2021-07-08 DIAGNOSIS — K766 Portal hypertension: Secondary | ICD-10-CM | POA: Diagnosis present

## 2021-07-08 DIAGNOSIS — D539 Nutritional anemia, unspecified: Secondary | ICD-10-CM | POA: Diagnosis present

## 2021-07-08 DIAGNOSIS — Z87891 Personal history of nicotine dependence: Secondary | ICD-10-CM | POA: Diagnosis not present

## 2021-07-08 DIAGNOSIS — K3189 Other diseases of stomach and duodenum: Secondary | ICD-10-CM | POA: Diagnosis present

## 2021-07-08 DIAGNOSIS — D689 Coagulation defect, unspecified: Secondary | ICD-10-CM | POA: Diagnosis present

## 2021-07-08 DIAGNOSIS — E871 Hypo-osmolality and hyponatremia: Secondary | ICD-10-CM | POA: Diagnosis present

## 2021-07-08 DIAGNOSIS — I8510 Secondary esophageal varices without bleeding: Secondary | ICD-10-CM | POA: Diagnosis present

## 2021-07-08 DIAGNOSIS — I864 Gastric varices: Secondary | ICD-10-CM | POA: Diagnosis present

## 2021-07-08 DIAGNOSIS — Z79899 Other long term (current) drug therapy: Secondary | ICD-10-CM

## 2021-07-08 DIAGNOSIS — K7200 Acute and subacute hepatic failure without coma: Secondary | ICD-10-CM | POA: Diagnosis present

## 2021-07-08 LAB — COMPREHENSIVE METABOLIC PANEL
ALT: 48 U/L — ABNORMAL HIGH (ref 0–44)
AST: 106 U/L — ABNORMAL HIGH (ref 15–41)
Albumin: <1.5 g/dL — ABNORMAL LOW (ref 3.5–5.0)
Alkaline Phosphatase: 265 U/L — ABNORMAL HIGH (ref 38–126)
Anion gap: 12 (ref 5–15)
BUN: 142 mg/dL — ABNORMAL HIGH (ref 6–20)
CO2: 12 mmol/L — ABNORMAL LOW (ref 22–32)
Calcium: 6.9 mg/dL — ABNORMAL LOW (ref 8.9–10.3)
Chloride: 97 mmol/L — ABNORMAL LOW (ref 98–111)
Creatinine, Ser: UNDETERMINED mg/dL (ref 0.61–1.24)
Glucose, Bld: 114 mg/dL — ABNORMAL HIGH (ref 70–99)
Potassium: 4.3 mmol/L (ref 3.5–5.1)
Sodium: 121 mmol/L — ABNORMAL LOW (ref 135–145)
Total Bilirubin: 23.5 mg/dL (ref 0.3–1.2)
Total Protein: 5.6 g/dL — ABNORMAL LOW (ref 6.5–8.1)

## 2021-07-08 LAB — HEPATIC FUNCTION PANEL
ALT: 43 U/L (ref 0–44)
AST: 112 U/L — ABNORMAL HIGH (ref 15–41)
Albumin: 1.5 g/dL — ABNORMAL LOW (ref 3.5–5.0)
Alkaline Phosphatase: 260 U/L — ABNORMAL HIGH (ref 38–126)
Bilirubin, Direct: 13.2 mg/dL — ABNORMAL HIGH (ref 0.0–0.2)
Indirect Bilirubin: 10.4 mg/dL — ABNORMAL HIGH (ref 0.3–0.9)
Total Bilirubin: 23.6 mg/dL (ref 0.3–1.2)
Total Protein: 5.6 g/dL — ABNORMAL LOW (ref 6.5–8.1)

## 2021-07-08 LAB — BASIC METABOLIC PANEL
Anion gap: 18 — ABNORMAL HIGH (ref 5–15)
BUN: 145 mg/dL — ABNORMAL HIGH (ref 6–20)
CO2: 10 mmol/L — ABNORMAL LOW (ref 22–32)
Calcium: 7.4 mg/dL — ABNORMAL LOW (ref 8.9–10.3)
Chloride: 93 mmol/L — ABNORMAL LOW (ref 98–111)
Creatinine, Ser: UNDETERMINED mg/dL (ref 0.61–1.24)
Glucose, Bld: 133 mg/dL — ABNORMAL HIGH (ref 70–99)
Potassium: 4.3 mmol/L (ref 3.5–5.1)
Sodium: 121 mmol/L — ABNORMAL LOW (ref 135–145)

## 2021-07-08 LAB — AMMONIA: Ammonia: 59 umol/L — ABNORMAL HIGH (ref 9–35)

## 2021-07-08 LAB — PROCALCITONIN: Procalcitonin: 0.37 ng/mL

## 2021-07-08 LAB — BLOOD GAS, VENOUS
Acid-base deficit: 14.4 mmol/L — ABNORMAL HIGH (ref 0.0–2.0)
Bicarbonate: 10.3 mmol/L — ABNORMAL LOW (ref 20.0–28.0)
O2 Saturation: 40 %
Patient temperature: 37
pCO2, Ven: 22 mmHg — ABNORMAL LOW (ref 44–60)
pH, Ven: 7.28 (ref 7.25–7.43)
pO2, Ven: <31 mmHg — CL (ref 32–45)

## 2021-07-08 LAB — RESP PANEL BY RT-PCR (FLU A&B, COVID) ARPGX2
Influenza A by PCR: NEGATIVE
Influenza B by PCR: NEGATIVE
SARS Coronavirus 2 by RT PCR: NEGATIVE

## 2021-07-08 LAB — CBC
HCT: 24.7 % — ABNORMAL LOW (ref 39.0–52.0)
Hemoglobin: 8.6 g/dL — ABNORMAL LOW (ref 13.0–17.0)
MCH: 32.7 pg (ref 26.0–34.0)
MCHC: 34.8 g/dL (ref 30.0–36.0)
MCV: 93.9 fL (ref 80.0–100.0)
Platelets: 324 10*3/uL (ref 150–400)
RBC: 2.63 MIL/uL — ABNORMAL LOW (ref 4.22–5.81)
RDW: 13.2 % (ref 11.5–15.5)
WBC: 32 10*3/uL — ABNORMAL HIGH (ref 4.0–10.5)
nRBC: 0 % (ref 0.0–0.2)

## 2021-07-08 LAB — LIPASE, BLOOD: Lipase: 133 U/L — ABNORMAL HIGH (ref 11–51)

## 2021-07-08 LAB — LACTIC ACID, PLASMA
Lactic Acid, Venous: 2.1 mmol/L (ref 0.5–1.9)
Lactic Acid, Venous: 1 mmol/L (ref 0.5–1.9)

## 2021-07-08 LAB — APTT: aPTT: 50 seconds — ABNORMAL HIGH (ref 24–36)

## 2021-07-08 LAB — ETHANOL: Alcohol, Ethyl (B): <10 mg/dL (ref <10)

## 2021-07-08 LAB — GLUCOSE, CAPILLARY: Glucose-Capillary: 92 mg/dL (ref 70–99)

## 2021-07-08 LAB — PROTIME-INR
INR: 1.8 — ABNORMAL HIGH (ref 0.8–1.2)
Prothrombin Time: 21 seconds — ABNORMAL HIGH (ref 11.4–15.2)

## 2021-07-08 LAB — MRSA NEXT GEN BY PCR, NASAL: MRSA by PCR Next Gen: NOT DETECTED

## 2021-07-08 LAB — PHOSPHORUS: Phosphorus: UNDETERMINED mg/dL (ref 2.5–4.6)

## 2021-07-08 LAB — FIBRINOGEN: Fibrinogen: 300 mg/dL (ref 210–475)

## 2021-07-08 LAB — CK: Total CK: 27 U/L — ABNORMAL LOW (ref 49–397)

## 2021-07-08 LAB — ACETAMINOPHEN LEVEL: Acetaminophen (Tylenol), Serum: <10 ug/mL — ABNORMAL LOW (ref 10–30)

## 2021-07-08 MED ORDER — VANCOMYCIN HCL 2000 MG/400ML IV SOLN
2000.0000 mg | Freq: Once | INTRAVENOUS | Status: AC
  Administered 2021-07-08: 2000 mg via INTRAVENOUS
  Filled 2021-07-08: qty 400

## 2021-07-08 MED ORDER — METRONIDAZOLE 500 MG/100ML IV SOLN
500.0000 mg | Freq: Once | INTRAVENOUS | Status: AC
  Administered 2021-07-08: 500 mg via INTRAVENOUS
  Filled 2021-07-08: qty 100

## 2021-07-08 MED ORDER — SODIUM CHLORIDE 0.9 % IV SOLN
2.0000 g | Freq: Once | INTRAVENOUS | Status: AC
  Administered 2021-07-08: 2 g via INTRAVENOUS
  Filled 2021-07-08: qty 12.5

## 2021-07-08 MED ORDER — CHLORHEXIDINE GLUCONATE CLOTH 2 % EX PADS: 6.0000 | MEDICATED_PAD | Freq: Every day | CUTANEOUS | Status: AC

## 2021-07-08 MED ORDER — OCTREOTIDE LOAD VIA INFUSION: 50.0000 ug | Freq: Once | INTRAVENOUS | Status: DC

## 2021-07-08 MED ORDER — NOREPINEPHRINE 4 MG/250ML-% IV SOLN
2.0000 ug/min | INTRAVENOUS | Status: DC
  Filled 2021-07-08: qty 250

## 2021-07-08 MED ORDER — FENTANYL CITRATE PF 50 MCG/ML IJ SOSY
12.5000 ug | PREFILLED_SYRINGE | Freq: Once | INTRAMUSCULAR | Status: AC
  Administered 2021-07-08: 12.5 ug via INTRAVENOUS
  Filled 2021-07-08: qty 1

## 2021-07-08 MED ORDER — NOREPINEPHRINE 4 MG/250ML-% IV SOLN: 0.0000 ug/min | INTRAVENOUS | Status: AC

## 2021-07-08 MED ORDER — SODIUM CHLORIDE 0.9 % IV SOLN: 50.0000 ug/h | INTRAVENOUS | Status: AC

## 2021-07-08 MED ORDER — PIPERACILLIN-TAZOBACTAM 3.375 G IVPB
3.3750 g | Freq: Three times a day (TID) | INTRAVENOUS | Status: DC
  Administered 2021-07-08 – 2021-07-09 (×2): 3.375 g via INTRAVENOUS
  Filled 2021-07-08 (×2): qty 50

## 2021-07-08 MED ORDER — SODIUM CHLORIDE 0.9 % IV SOLN: 50.0000 ug/h | INTRAVENOUS | Status: DC

## 2021-07-08 MED ORDER — RIFAXIMIN 550 MG PO TABS
550.0000 mg | ORAL_TABLET | Freq: Two times a day (BID) | ORAL | Status: DC
  Administered 2021-07-08: 550 mg via ORAL
  Filled 2021-07-08: qty 1

## 2021-07-08 MED ORDER — PANTOPRAZOLE SODIUM 40 MG IV SOLR
40.0000 mg | INTRAVENOUS | Status: DC
  Administered 2021-07-08: 40 mg via INTRAVENOUS
  Filled 2021-07-08: qty 10

## 2021-07-08 MED ORDER — SODIUM CHLORIDE 0.9 % IV SOLN
10.0000 mL | INTRAVENOUS | 0 refills | Status: AC
Start: 2021-07-08 — End: ?

## 2021-07-08 MED ORDER — ALBUMIN HUMAN 25 % IV SOLN
100.0000 g | Freq: Once | INTRAVENOUS | Status: DC
  Filled 2021-07-08: qty 400

## 2021-07-08 MED ORDER — MIDODRINE HCL 5 MG PO TABS
10.0000 mg | ORAL_TABLET | Freq: Three times a day (TID) | ORAL | Status: DC
  Administered 2021-07-08: 10 mg via ORAL
  Filled 2021-07-08: qty 2

## 2021-07-08 MED ORDER — RIFAXIMIN 550 MG PO TABS: 550.0000 mg | ORAL_TABLET | Freq: Two times a day (BID) | ORAL | Status: AC

## 2021-07-08 MED ORDER — SODIUM CHLORIDE 0.9 % IV SOLN
250.0000 mL | INTRAVENOUS | Status: DC
  Administered 2021-07-08: 250 mL via INTRAVENOUS

## 2021-07-08 MED ORDER — OCTREOTIDE LOAD VIA INFUSION
50.0000 ug | Freq: Once | INTRAVENOUS | Status: AC
  Administered 2021-07-08: 50 ug via INTRAVENOUS
  Filled 2021-07-08: qty 25

## 2021-07-08 MED ORDER — SODIUM CHLORIDE 0.9 % IV SOLN
50.0000 ug/h | INTRAVENOUS | Status: DC
  Administered 2021-07-08: 50 ug/h via INTRAVENOUS
  Filled 2021-07-08 (×3): qty 1

## 2021-07-08 MED ORDER — VANCOMYCIN VARIABLE DOSE PER UNSTABLE RENAL FUNCTION (PHARMACIST DOSING): Status: DC

## 2021-07-08 MED ORDER — CHLORHEXIDINE GLUCONATE CLOTH 2 % EX PADS
6.0000 | MEDICATED_PAD | Freq: Every day | CUTANEOUS | Status: DC
  Administered 2021-07-08: 6 via TOPICAL

## 2021-07-08 MED ORDER — VANCOMYCIN HCL IN DEXTROSE 1-5 GM/200ML-% IV SOLN: 1000.0000 mg | Freq: Once | INTRAVENOUS | Status: DC

## 2021-07-08 MED ORDER — MIDODRINE HCL 10 MG PO TABS
10.0000 mg | ORAL_TABLET | Freq: Three times a day (TID) | ORAL | Status: AC
Start: 2021-07-08 — End: ?

## 2021-07-08 MED ORDER — OCTREOTIDE LOAD VIA INFUSION: 50.0000 ug | Freq: Once | INTRAVENOUS | 0 refills | Status: AC

## 2021-07-08 MED ORDER — ALBUMIN HUMAN 25 % IV SOLN: 100.0000 g | Freq: Once | INTRAVENOUS | Status: AC

## 2021-07-08 MED ORDER — PANTOPRAZOLE SODIUM 40 MG IV SOLR: 40.0000 mg | INTRAVENOUS | Status: AC

## 2021-07-08 MED ORDER — MIDAZOLAM HCL 2 MG/2ML IJ SOLN
0.5000 mg | Freq: Once | INTRAMUSCULAR | Status: DC
  Filled 2021-07-08: qty 2

## 2021-07-08 MED ORDER — ALBUMIN HUMAN 25 % IV SOLN
12.5000 g | Freq: Two times a day (BID) | INTRAVENOUS | Status: DC
Start: 2021-07-08 — End: 2021-07-08

## 2021-07-08 MED ORDER — PIPERACILLIN-TAZOBACTAM 3.375 G IVPB: 3.3750 g | Freq: Three times a day (TID) | INTRAVENOUS | Status: AC

## 2021-07-08 MED ORDER — RIFAXIMIN 550 MG PO TABS: 550.0000 mg | ORAL_TABLET | Freq: Two times a day (BID) | ORAL | Status: DC

## 2021-07-08 MED ORDER — STERILE WATER FOR INJECTION IV SOLN
INTRAVENOUS | Status: DC
  Filled 2021-07-08 (×2): qty 1000
  Filled 2021-07-08: qty 150

## 2021-07-08 MED ORDER — SODIUM CHLORIDE 0.9 % IV BOLUS
2000.0000 mL | Freq: Once | INTRAVENOUS | Status: AC
  Administered 2021-07-08: 2000 mL via INTRAVENOUS

## 2021-07-08 MED ORDER — ALBUMIN HUMAN 25 % IV SOLN: 25.0000 g | Freq: Four times a day (QID) | INTRAVENOUS | Status: DC

## 2021-07-08 MED ORDER — SODIUM CHLORIDE 0.9 % IV SOLN: INTRAVENOUS | Status: DC

## 2021-07-08 MED ORDER — SODIUM CHLORIDE 0.9 % IV SOLN: 250.0000 mL | INTRAVENOUS | 0 refills | Status: AC

## 2021-07-08 MED ORDER — ALBUMIN HUMAN 25 % IV SOLN
100.0000 g | Freq: Once | INTRAVENOUS | Status: AC
  Administered 2021-07-08: 25 g via INTRAVENOUS
  Filled 2021-07-08: qty 400

## 2021-07-08 MED ORDER — METRONIDAZOLE 500 MG/100ML IV SOLN
500.0000 mg | Freq: Two times a day (BID) | INTRAVENOUS | Status: DC
  Filled 2021-07-08: qty 100

## 2021-07-08 NOTE — Procedures (Signed)
Central Venous Catheter Insertion Procedure Note LITTLE WINTON [id number] October 18, 1980  Procedure: Insertion of Central Venous Catheter Indications: Assessment of intravascular volume, Drug and/or fluid administration, and Frequent blood sampling  Procedure Details Consent: Risks of procedure as well as the alternatives and risks of each were explained to the (patient/caregiver).  Consent for procedure obtained. Time Out: Verified patient identification, verified procedure, site/side was marked, verified correct patient position, special equipment/implants available, medications/allergies/relevent history reviewed, required imaging and test results available.  Performed  Maximum sterile technique was used including antiseptics, cap, gloves, gown, hand hygiene, mask, and sheet. Skin prep: Chlorhexidine; local anesthetic administered A antimicrobial bonded/coated triple lumen catheter was placed in the left internal jugular vein using the Seldinger technique.  Evaluation Blood flow good Complications: No apparent complications Patient did tolerate procedure well. Chest X-ray ordered to verify placement.  CXR: pending.    Line secured at the 20 cm mark. BIOPATCH applied to the site.   Harlon Ditty, AGACNP-BC [facility] Prefer epic messenger for cross cover needs If after hours, please call E-link   Judithe Modest 07/08/2021, 9:08 PM

## 2021-07-08 NOTE — ED Provider Notes (Addendum)
[facility] Provider Note    Event Date/Time   First MD Initiated Contact with Patient 07/08/21 1209     (approximate)   History   Fever and Weakness   HPI  Anthony Torres is a 41 y.o. male history of alcoholic cirrhosis presents to the ER for worsening weakness malaise chills and fevers at home.  No measured temperature but has felt unwell for the past several days.  Has had very poor p.o. intake.  Does feel like his abdomen is more distended and complaining of some mild back pain.  Not on any antibiotics.     Physical Exam   Triage Vital Signs: ED Triage Vitals  Enc Vitals Group     BP 07/08/21 1113 (!) 103/45     Pulse Rate 07/08/21 1113 95     Resp 07/08/21 1113 18     Temp 07/08/21 1113 (!) 97.5 F (36.4 C)     Temp Source 07/08/21 1113 Oral     SpO2 07/08/21 1113 97 %     Weight 07/08/21 1108 213 lb 13.5 oz (97 kg)     Height 07/08/21 1108 5\' 10"  (1.778 m)     Head Circumference --      Peak Flow --      Pain Score 07/08/21 1108 0     Pain Loc --      Pain Edu? --      Excl. in GC? --     Most recent vital signs: Vitals:   07/09/21 0800 07/09/21 0830  BP: (!) 100/51 (!) 101/54  Pulse: 94 95  Resp: 16 15  Temp: (!) 96.9 F (36.1 C)   SpO2: 96% 96%     Constitutional: Alert, ill appearing, jaundiced Head: Atraumatic. Nose: No congestion/rhinnorhea. Mouth/Throat: Mucous membranes are moist.   Neck: Painless ROM.  Cardiovascular:   Good peripheral circulation. No m/g/r Respiratory: Normal respiratory effort.  No retractions. No wheeze Gastrointestinal: Soft, distended, no guarding or rebound Musculoskeletal:  no deformity Neurologic:  MAE spontaneously. No gross focal neurologic deficits are appreciated.  Skin:  Skin is warm, dry and intact. No rash noted. Psychiatric: Mood and affect are normal. Speech and behavior are normal.    ED Results / Procedures / Treatments   Labs (all labs ordered are listed, but only  abnormal results are displayed) Labs Reviewed  C DIFFICILE QUICK SCREEN W PCR REFLEX   - Abnormal; Notable for the following components:      Result Value   C Diff antigen POSITIVE (*)    All other components within normal limits  CLOSTRIDIUM DIFFICILE BY PCR, REFLEXED - Abnormal; Notable for the following components:   Toxigenic C. Difficile by PCR POSITIVE (*)    All other components within normal limits  BASIC METABOLIC PANEL - Abnormal; Notable for the following components:   Sodium 121 (*)    Chloride 93 (*)    CO2 10 (*)    Glucose, Bld 133 (*)    BUN 145 (*)    Calcium 7.4 (*)    Anion gap 18 (*)    All other components within normal limits  CBC - Abnormal; Notable for the following components:   WBC 32.0 (*)    RBC 2.63 (*)    Hemoglobin 8.6 (*)    HCT 24.7 (*)    All other components within normal limits  LACTIC ACID, PLASMA - Abnormal; Notable for the following components:   Lactic Acid, Venous 2.1 (*)  All other components within normal limits  PROTIME-INR - Abnormal; Notable for the following components:   Prothrombin Time 21.0 (*)    INR 1.8 (*)    All other components within normal limits  APTT - Abnormal; Notable for the following components:   aPTT 50 (*)    All other components within normal limits  URINALYSIS, COMPLETE (UACMP) WITH MICROSCOPIC - Abnormal; Notable for the following components:   Color, Urine AMBER (*)    APPearance CLEAR (*)    Bilirubin Urine SMALL (*)    All other components within normal limits  HEPATIC FUNCTION PANEL - Abnormal; Notable for the following components:   Total Protein 5.6 (*)    Albumin 1.5 (*)    AST 112 (*)    Alkaline Phosphatase 260 (*)    Total Bilirubin 23.6 (*)    Bilirubin, Direct 13.2 (*)    Indirect Bilirubin 10.4 (*)    All other components within normal limits  LIPASE, BLOOD - Abnormal; Notable for the following components:   Lipase 133 (*)    All other components within normal limits  AMMONIA -  Abnormal; Notable for the following components:   Ammonia 59 (*)    All other components within normal limits  COMPREHENSIVE METABOLIC PANEL - Abnormal; Notable for the following components:   Sodium 121 (*)    Chloride 97 (*)    CO2 12 (*)    Glucose, Bld 114 (*)    BUN 142 (*)    Calcium 6.9 (*)    Total Protein 5.6 (*)    Albumin <1.5 (*)    AST 106 (*)    ALT 48 (*)    Alkaline Phosphatase 265 (*)    Total Bilirubin 23.5 (*)    All other components within normal limits  BLOOD GAS, VENOUS - Abnormal; Notable for the following components:   pCO2, Ven 22 (*)    pO2, Ven <31 (*)    Bicarbonate 10.3 (*)    Acid-base deficit 14.4 (*)    All other components within normal limits  ACETAMINOPHEN LEVEL - Abnormal; Notable for the following components:   Acetaminophen (Tylenol), Serum <10 (*)    All other components within normal limits  OSMOLALITY - Abnormal; Notable for the following components:   Osmolality 313 (*)    All other components within normal limits  CBC - Abnormal; Notable for the following components:   WBC 29.7 (*)    RBC 2.11 (*)    Hemoglobin 7.0 (*)    HCT 19.2 (*)    MCHC 36.5 (*)    All other components within normal limits  COMPREHENSIVE METABOLIC PANEL - Abnormal; Notable for the following components:   Sodium 125 (*)    CO2 11 (*)    Glucose, Bld 103 (*)    BUN 143 (*)    Creatinine, Ser 6.02 (*)    Calcium 6.3 (*)    Total Protein 4.7 (*)    Albumin <1.5 (*)    AST 94 (*)    Alkaline Phosphatase 222 (*)    Total Bilirubin 19.4 (*)    GFR, Estimated 11 (*)    All other components within normal limits  ANTI-SMOOTH MUSCLE ANTIBODY, IGG - Abnormal; Notable for the following components:   F-Actin IgG 22 (*)    All other components within normal limits  CK - Abnormal; Notable for the following components:   Total CK 27 (*)    All other components within normal limits  GLUCOSE, CAPILLARY - Abnormal; Notable for the following components:    Glucose-Capillary 110 (*)    All other components within normal limits  RESP PANEL BY RT-PCR (FLU A&B, COVID) ARPGX2  CULTURE, BLOOD (ROUTINE X 2)  CULTURE, BLOOD (ROUTINE X 2)  MRSA NEXT GEN BY PCR, NASAL  URINE CULTURE  GASTROINTESTINAL PANEL BY PCR, STOOL (REPLACES STOOL CULTURE)  LACTIC ACID, PLASMA  FIBRINOGEN  HCV AB W REFLEX TO QUANT PCR  URINE DRUG SCREEN, QUALITATIVE ([facility] ONLY)  ETHANOL  PHOSPHORUS  PROCALCITONIN  PROCALCITONIN  SODIUM, URINE, RANDOM  VANCOMYCIN, RANDOM  GLUCOSE, CAPILLARY  OSMOLALITY, URINE  HCV INTERPRETATION     EKG  ED ECG REPORT I, Willy EddyPatrick Ermina Oberman, the attending physician, personally viewed and interpreted this ECG.   Date: 07/08/2021  EKG Time: 11:17  Rate: 95  Rhythm: sinus  Axis: normal  Intervals: normal qt  ST&T Change: no stemi, nonspeicifc st abn    RADIOLOGY Please see ED Course for my review and interpretation.  I personally reviewed all radiographic images ordered to evaluate for the above acute complaints and reviewed radiology reports and findings.  These findings were personally discussed with the patient.  Please see medical record for radiology report.    PROCEDURES:  Critical Care performed: Yes, see critical care procedure note(s)  .Critical Care  Performed by: Willy Eddyobinson, Cypher Paule, MD Authorized by: Willy Eddyobinson, Livio Ledwith, MD   Critical care provider statement:    Critical care time (minutes):  35   Critical care was necessary to treat or prevent imminent or life-threatening deterioration of the following conditions:  Hepatic failure   Critical care was time spent personally by me on the following activities:  Ordering and performing treatments and interventions, ordering and review of laboratory studies, ordering and review of radiographic studies, pulse oximetry, re-evaluation of patient's condition, review of old charts, obtaining history from patient or surrogate, examination of patient, evaluation of patient's  response to treatment, discussions with primary provider, discussions with consultants and development of treatment plan with patient or surrogate    MEDICATIONS ORDERED IN ED: Medications  ceFEPIme (MAXIPIME) 2 g in sodium chloride 0.9 % 100 mL IVPB (0 g Intravenous Stopped 07/08/21 1359)  metroNIDAZOLE (FLAGYL) IVPB 500 mg (0 mg Intravenous Stopped 07/08/21 1402)  vancomycin (VANCOREADY) IVPB 2000 mg/400 mL (0 mg Intravenous Stopped 07/08/21 1612)  sodium chloride 0.9 % bolus 2,000 mL (0 mLs Intravenous Stopped 07/08/21 1741)  octreotide (SANDOSTATIN) 2 mcg/mL load via infusion 50 mcg (50 mcg Intravenous Bolus from Bag 07/08/21 2045)  fentaNYL (SUBLIMAZE) injection 12.5 mcg (12.5 mcg Intravenous Given 07/08/21 2130)  albumin human 25 % solution 100 g (25 g Intravenous New Bag/Given 07/08/21 2145)  fentaNYL (SUBLIMAZE) injection 12.5 mcg (12.5 mcg Intravenous Given 07/09/21 0208)     IMPRESSION / MDM / ASSESSMENT AND PLAN / ED COURSE  I reviewed the triage vital signs and the nursing notes.                              Differential diagnosis includes, but is not limited to, Dehydration, sepsis, pna, uti, hypoglycemia, sbp, hepatic encephalopathy  Patient very ill-appearing in setting of known alcoholic cirrhosis with ascites presented to the ER with fevers chills weakness and findings concerning for sepsis.  No guarding on exam but does report some epigastric pain.  Right upper quadrant ultrasound ordered for the but differential by my view does have some ascites but in poor location for  bedside paracentesis Shahzad order IR  paracentesis to evaluate for SBP Spero order IV fluids as well as broad-spectrum antibiotics.  This presenting complaint could reflect a potentially life-threatening illness therefore the patient Ralf be placed on continuous pulse oximetry and telemetry for monitoring.  Laboratory evaluation Josie be sent to evaluate for the above complaints.      Clinical Course as of 07/19/21  1524  Mon Jul 08, 2021  1333 Ultrasound my interpretation does show evidence of cholelithiasis small mount of ascites.  Per radiology report concern for possible cholecystitis recommending HIDA scan to further evaluate.  Have ordered HIDA scan.  Patient receiving IV antibiotics. [PR]  1411 I have ordered broad-spectrum antibiotics as well as IV fluids for presumed sepsis.  Still awaiting LFTs and lipase.  Initial lactate likely elevated secondary to cirrhosis but possible sepsis therefore Hasani give IV fluid. [PR]  1503 Discussed case in consultation with Dr. Timothy Lasso GI.  Given the patient's high MELD score (mid to high 30's) has recommended trying to reach out to [facility] or [facility] for transfer for hepatology eval.  Recommends against HIDA scan at this point given its low utility in setting of significant high bilirubin.  Repeat abdominal exam without any significant tenderness have lower suspicion for cholecystitis at this time.  Patient Lain require admission, Bethany reach out to [facility] per patient's request.  [PR]  1627 Both [facility] and [facility] are on diversion.  To better manage his sepsis Mikaeel speak with hospitalist in consultation for admission to this hospital for further stabilization. [PR]  1631 Case discussed in consultation with Dr. Claudine Mouton of general surgery regarding cholelithiasis and possible cholecystitis given mixed picture with his advanced cirrhosis and high MELD score Junius be poor operative candidate and has recommended medical management at this time. [PR]  1653 Case discussed in consultation with hospitalist who agrees admit patient to their service.  Repeat lactate cleared. [PR]    Clinical Course User Index [PR] Willy Eddy, MD    Patient's presentation is most consistent with acute presentation with potential threat to life or bodily function.   FINAL CLINICAL IMPRESSION(S) / ED DIAGNOSES   Final diagnoses:  Sepsis, due to unspecified organism, unspecified whether acute organ  dysfunction present [facility])  Alcoholic cirrhosis of liver with ascites (HCC)     Rx / DC Orders   ED Discharge Orders          Ordered    Chlorhexidine Gluconate Cloth 2 % PADS  Daily        07/08/21 2251    albumin human 25 % bottle   Once        07/08/21 2251    norepinephrine (LEVOPHED) 16-5 MG/250ML-% SOLN  Continuous        07/09/21 0707    sodium chloride 0.9 % infusion  Continuous        07/08/21 2251    sodium chloride 0.9 % infusion  Continuous        07/08/21 2251    norepinephrine (LEVOPHED) 4-5 MG/250ML-% SOLN  Continuous        07/08/21 2251    octreotide (SANDOSTATIN) 2 mcg/mL SOLN   Once        07/08/21 2251    octreotide 500 mcg in sodium chloride 0.9 % 250 mL  Continuous        07/08/21 2251    pantoprazole (PROTONIX) 40 MG injection  Every 24 hours        07/08/21 2251    rifaximin (  XIFAXAN) 550 MG TABS tablet  2 times daily        07/08/21 2251    midodrine (PROAMATINE) 10 MG tablet  3 times daily with meals        07/08/21 2251    piperacillin-tazobactam (ZOSYN) 3.375 GM/50ML IVPB  Every 8 hours        07/08/21 2251    albumin human 25 % bottle   Once        07/08/21 2251             Note:  This document was prepared using Dragon voice recognition software and may include unintentional dictation errors.    Willy Eddy, MD 07/08/21 1635    Willy Eddy, MD 07/08/21 1654    Willy Eddy, MD 07/19/21 1524

## 2021-07-08 NOTE — H&P (Incomplete)
History and Physical    Anthony Torres WSF:[id number] DOB: 08/07/1980 DOA: 07/08/2021 PCP: Margaretann Loveless, MD  Chief Complaint: *** Historian: patient  HPI:  [name] is a 41 y.o. male with a PMH significant for ***. At baseline, they live *** and *** ADLs.  They presented from *** to the ED on 07/08/2021 with *** x*** days. ***  In the ED, it was found that they had ***.  Significant findings included:  They were initially treated with ***.   Patient was admitted to medicine service for further workup and management of *** as outlined in detail below.  Assessment/Plan Active Problems:   * No active hospital problems. *    ***  ***  ***  ***  History reviewed. No pertinent past medical history.  Past Surgical History:  Procedure Laterality Date   ESOPHAGOGASTRODUODENOSCOPY N/A 07/01/2021   Procedure: ESOPHAGOGASTRODUODENOSCOPY (EGD);  Surgeon: [name], DO;  Location: [facility] ENDOSCOPY;  Service: Gastroenterology;  Laterality: N/A;     reports that he quit smoking about 4 years ago. His smoking use included cigarettes. He has never used smokeless tobacco. He reports current alcohol use. He reports that he does not use drugs.  No Known Allergies  No family history on file.  Prior to Admission medications   Medication Sig Start Date End Date Taking? Authorizing Provider  folic acid (FOLVITE) 1 MG tablet Take 1 tablet (1 mg total) by mouth daily. 06/01/21  Yes Esaw Grandchild A, DO  hydrOXYzine (ATARAX) 25 MG tablet Take 25 mg by mouth 3 (three) times daily as needed. 06/26/21  Yes [provider]  lactulose (CHRONULAC) 10 GM/15ML solution Take 45 mLs (30 g total) by mouth 2 (two) times daily for 21 days. 06/19/21 07/10/21 Yes Sunnie Nielsen, DO  Multiple Vitamin (MULTIVITAMIN WITH MINERALS) TABS tablet Take 1 tablet by mouth daily. 06/01/21  Yes Esaw Grandchild A, DO  omeprazole (PRILOSEC OTC) 20 MG tablet Take 20 mg by mouth daily.   Yes  [provider]  spironolactone (ALDACTONE) 100 MG tablet Take 1 tablet (100 mg total) by mouth daily. 06/01/21  Yes Esaw Grandchild A, DO  thiamine 100 MG tablet Take 1 tablet (100 mg total) by mouth daily. 06/01/21  Yes Esaw Grandchild A, DO  calcium carbonate (TUMS - DOSED IN MG ELEMENTAL CALCIUM) 500 MG chewable tablet Chew 1 tablet (200 mg of elemental calcium total) by mouth 3 (three) times daily as needed for indigestion or heartburn. Patient not taking: Reported on 06/18/2021 05/31/21   Esaw Grandchild A, DO  furosemide (LASIX) 40 MG tablet Take 1 tablet (40 mg total) by mouth 2 (two) times daily. Patient not taking: Reported on 07/08/2021 06/19/21   Sunnie Nielsen, DO  pantoprazole (PROTONIX) 40 MG tablet Take 1 tablet (40 mg total) by mouth daily for 14 days. 12/05/20 12/19/20  Shaune Pollack, MD   I have personally, briefly reviewed patient's prior medical records in [place] [place] [facility]  Objective: Blood pressure (!) 97/52, pulse 89, temperature (!) 97.5 F (36.4 C), temperature source Oral, resp. rate 17, height 5\' 10"  (1.778 m), weight 97 kg, SpO2 97 %.   Constitutional: NAD, calm, comfortable*** HEENT: lids and conjunctivae normal. MMM. Posterior pharynx clear of any exudate or lesions. Normal dentition.  Neck: normal, supple, no masses, no thyromegaly Respiratory: CTAB, no wheezing, no crackles. Normal respiratory effort. No accessory muscle use.  Cardiovascular: RRR, no murmurs / rubs / gallops. No extremity edema. 2+ pedal pulses. no clubbing /  cyanosis.  Abdomen: soft, NT, ND, no masses or HSM palpated. Musculoskeletal: No joint deformity upper and lower extremities. Normal muscle tone.  Skin: dry, intact, normal color, normal temperature on exposed skin Neurologic: Alert and oriented x 3. Normal speech. Grossly non-focal exam. PERRL Psychiatric: Normal mood. Congruent affect.  Labs on Admission: I have personally reviewed admission labs and imaging studies  CBC     Component Value Date/Time   WBC 32.0 (H) 07/08/2021 1112   RBC 2.63 (L) 07/08/2021 1112   HGB 8.6 (L) 07/08/2021 1112   HCT 24.7 (L) 07/08/2021 1112   PLT 324 07/08/2021 1112   MCV 93.9 07/08/2021 1112   MCH 32.7 07/08/2021 1112   MCHC 34.8 07/08/2021 1112   RDW 13.2 07/08/2021 1112   LYMPHSABS 1.4 05/26/2021 0640   MONOABS 0.9 05/26/2021 0640   EOSABS 0.2 05/26/2021 0640   BASOSABS 0.1 05/26/2021 0640   CMP     Component Value Date/Time   NA 121 (L) 07/08/2021 1112   K 4.3 07/08/2021 1112   CL 93 (L) 07/08/2021 1112   CO2 10 (L) 07/08/2021 1112   GLUCOSE 133 (H) 07/08/2021 1112   BUN 145 (H) 07/08/2021 1112   CREATININE UNABLE TO REPORT DUE TO ICTERUS 07/08/2021 1112   CALCIUM 7.4 (L) 07/08/2021 1112   PROT 5.6 (L) 07/08/2021 1303   ALBUMIN 1.5 (L) 07/08/2021 1303   AST 112 (H) 07/08/2021 1303   ALT 43 07/08/2021 1303   ALKPHOS 260 (H) 07/08/2021 1303   BILITOT 23.6 (HH) 07/08/2021 1303   GFRNONAA NOT CALCULATED 07/08/2021 1112    Radiological Exams on Admission: DG Chest Port 1 View  Result Date: 07/08/2021 CLINICAL DATA:  Increasing weakness over the last couple of days. History of liver failure. Questionable sepsis. EXAM: PORTABLE CHEST 1 VIEW COMPARISON:  12/05/2020. FINDINGS: Cardiac silhouette is normal in size. No mediastinal or hilar masses. Mild opacities the lung bases consistent with atelectasis. Remainder of the lungs is clear. No pleural effusion.  No pneumothorax. Skeletal structures are grossly intact. IMPRESSION: No acute cardiopulmonary disease. Electronically Signed   By: Amie Portland M.D.   On: 07/08/2021 14:08   US ABDOMEN LIMITED RUQ (LIVER/GB)  Result Date: 07/08/2021 CLINICAL DATA:  Septic EXAM: ULTRASOUND ABDOMEN LIMITED RIGHT UPPER QUADRANT COMPARISON:  CT abdomen pelvis 05/26/2021 FINDINGS: Gallbladder: Gallstones: Mild cholelithiasis. Sludge: Present. Gallbladder Wall: Mild gallbladder wall thickening measuring up to 5 mm. Pericholecystic  fluid: None Sonographic Murphy's Sign: Negative per technologist Common bile duct: Diameter: 3 mm Liver: Parenchymal echogenicity: Within normal limits Contours: Mildly nodular. Lesions: None Portal vein: Hepatofugal flow. Other: Mild ascites. IMPRESSION: 1. Cholelithiasis and gallbladder wall thickening. Findings may be the result of acute cholecystitis. Further evaluation HIDA scan would be beneficial. 2. Cirrhotic liver morphology with reversed portal vein flow consistent with portal hypertension. No focal hepatic lesion. Electronically Signed   By: Acquanetta Belling M.D.   On: 07/08/2021 12:54    EKG: Independently reviewed. ***  DVT prophylaxis:    Code Status: ***  Family Communication: ***  Disposition Plan: ***  Consults called: Leeroy Bock, DO [facility]  07/08/2021, 4:53 PM    To contact the appropriate [facility] Attending or Consulting provider: Check [email or web address] for coverage from 7pm-7am

## 2021-07-08 NOTE — ED Notes (Signed)
US at bedside at this time 

## 2021-07-08 NOTE — ED Triage Notes (Signed)
Pt reports increased weakness over the last couple of days. Pt with liver failure, jaundice in color. Pt states he thinks he may have had a fever as well.

## 2021-07-08 NOTE — H&P (Signed)
Anthony Torres, MRN:  [id number], DOB:  [birth year], LOS: 0 ADMISSION DATE:  07/08/2021, CONSULTATION DATE: 07/08/2021 REFERRING MD: Dr. Ouida Sills, CHIEF COMPLAINT: Weakness   History of Present Illness:  This is a 41 yo male with a hx of alcoholic cirrhosis and hepatitis who presented to [facility] ER on 05/29 with c/o worsening weakness, malaise, fevers, and chills onset of symptoms several days prior to presentation.  He also reports poor po intake with some abdominal distension and tightness.  He recently underwent an upper endoscopy performed by Dr. Virgina Jock on 07/01/21 for evaluation of macrocytic anemia which revealed portal hypertensive gastropathy, type 1 isolated gastric varices located in the fundus without bleeding; grade I esophageal varices; and mucosal changes in the esophagus.  Recommendation at that time was medication management; repeat upper endoscopy in 6 months; follow-up with outpatient GI; and referral to hepatology.    ED Course In the ER lab results revealed Na+ 121, chloride 93, CO2 10, glucose 133, BUN 145, unable to calculate creatinine, calcium 7.4, anion gap 18, alk phos 260, albumin 1.5, lipase 133, AST 112, total protein 5.6, ammonia 59, total bilirubin 23.6, lactic acid 2.1, wbc 32.0, hgb 8.6, PT 21.0, and INR 1.8.  CXR and COVID-19 negative.  Pt also hypotensive.  Sepsis protocol initiated pt received cefepime, flagyl, vancomycin, and 2L NS bolus.  [place] Abd Limited RUQ concerning for cirrhotic liver morphology with portal hypertension; mild ascites; and cholelithiasis with gallbladder wall thickening possibly due to acute cholecystitis with recommendation for HIDA scan for further evaluation.    ED provider discussed case with on call gastroenterologist Dr. Virgina Jock who recommended transfer to [facility] or [facility] for hepatology evaluation due to pts high MELD score (in the mid to high 30's).  Dr. Virgina Jock also recommended against proceeding with HIDA scan due to its low utility in the  setting of pts significantly elevated bilirubin.  ER provider attempted to transfer pt to [place], however both facilities are on diversion per ER notes.  ED provider also discussed the case with General Surgeon Dr. Christian Mate due to US findings of cholelithiasis with possible cholecystitis.  However, Dr. Christian Mate deemed pt a poor operative candidate due to high MELD score and recommended medical management.  ER provider contacted hospitalist team for hospital admission, and pt initially admitted to the stepdown unit for additional workup and treatment.  However, PCCM team consulted and due to pts high acuity status changed to ICU and PCCM team assumed care.     Pertinent  Medical History  Alcoholic Cirrhosis and Hepatitis  Polysubstance Abuse  Former Smoker  Macrocytic Anemia  Portal Hypertensive Gastropathy  Grade I Esophageal Varices  Type 1 Isolated Gastric Varices  Chronic Coagulopathy   Significant Hospital Events: Including procedures, antibiotic start and stop dates in addition to other pertinent events   05/29: Pt admitted to ICU with septic shock secondary to suspected acute cholecystitis, anion gap metabolic acidosis, severe hyponatremia, and decompensated alcoholic liver cirrhosis and hepatitis  05/29: US Abdomen Limited RUQ revealed Cholelithiasis and        gallbladder wall thickening. Findings may be the result of acute        cholecystitis. Further evaluation HIDA scan would be beneficial.       Cirrhotic liver morphology with reversed portal vein flow           consistent with portal hypertension. No focal hepatic lesion. 05/29: CT Abd Pelvis revealed Cirrhosis, with portal venous  hypertension manifested by splenomegaly, increasing ascites,        and multiple varices throughout the abdomen. Cholelithiasis          without CT evidence for acute cholecystitis. Please refer to        preceding ultrasound evaluation. Dependent lower lobe        atelectasis, with  nonspecific bibasilar ground-glass opacities.        Differential would include hypoventilatory change, edema, or        atypical viral pneumonia. Nonspecific bowel wall thickening,        likely due to ascites and underlying hypoproteinemic state.  Interim History / Subjective:  Pt hypotensive but not on vasopressors sbp mid 70's, 12.5 g of 25% iv albumin ordered.  No signs of respiratory distress O2 sats 98% on RA.  Pt states he no longer drinks alcohol and quit following his previous hospitalization from 06/18/2021-06/19/2021  Objective   Blood pressure (!) 97/48, pulse 88, temperature (!) 97.4 F (36.3 C), temperature source Oral, resp. rate 15, height '5\' 10"'  (1.778 m), weight 97 kg, SpO2 98 %.        Intake/Output Summary (Last 24 hours) at 07/08/2021 1752 Last data filed at 07/08/2021 1741 Gross per 24 hour  Intake 2550 ml  Output --  Net 2550 ml   Filed Weights   07/08/21 1108  Weight: 97 kg    Examination: General: Acute on chronically ill appearing male, NAD resting in bed  HENT: Very poor dentition, no JVD  Lungs: Clear throughout, even, non labored  Cardiovascular: NSR, rrr, no r/g, 2+ radial/1+ distal pulses present, 1+ generalized edema  Abdomen: Ascites, distended, taut, mildly tender, +BS x4 Extremities: Moves all extremities  Neuro: Alert and oriented, follows commands, right prosthetic eye; left eye scleral icturis  Skin: Severely jaundiced with generalized spider angiomas  GU: Deferred   Resolved Hospital Problem list    Assessment & Plan:   Sepsis with septic shock suspected etiology unclear although possibly secondary to possible SBP vs. possible atypical viral pneumonia  - Trend WBC and monitor fever curve  - Trend PCT - Follow cultures  - Continue cefepime, vancomycin, and flagyl for now pending culture results  - Continuous telemetry monitoring  - Prn levophed gtt to maintain map >65  Severe anion gap metabolic acidosis  Severe hyponatremia likely  in the setting of volume depletion due to poor po intake  Mild lactic acidosis  - Trend BMP and VBG - Replace electrolytes as indicated  - Monitor UOP - Avoid nephrotoxic medications - Urine Na+, serum osmolality, and urine osmolality pending  - Sodium bicarb gtt '@100'  ml/hr for now  Decompensated alcoholic liver cirrhosis and hepatitis  Portal venous hypertension  Hx: Type 1 gastric varices and grade I esophageal varices  - GI consulted appreciate input: per recommendation octreotide bolus followed by octreotide gtt, 25g of iv albumin q6hrs, and [place] attempt to reach out to [facility] and/or [facility] for pt transfer  - Trend hepatic function panel and ammonia level  - Urine drug screen, tylenol level, and alcohol levels pending   Chronic macrocytic anemia  Thrombocytopenia secondary to alcoholic liver cirrhosis and hepatitis  - Monitor for s/sx of bleeding  - Transfuse for hgb <7 and/or active signs of bleeding  - Avoid chemical VTE px   Best Practice (right click and "Reselect all SmartList Selections" daily)   Diet/type: NPO DVT prophylaxis: SCD GI prophylaxis: PPI Lines: N/A Foley:  N/A Code Status:  full code Last  date of multidisciplinary goals of care discussion [N/A]  Discussed pts poor prognosis, code status, and goals of care with pt and pts sister Anthony Torres at bedside.  Anthony Torres states he would like to remain a FULL CODE.  However, if he requires mechanical intubation he would only want to remain on the ventilator for a short period of time.  He states he DOES NOT want a tracheostomy or PEG tube.  He also states if he were unable to make decisions for himself he would like his sister Anthony Torres to be his decision maker and states he does not have any children.  Anthony Torres acknowledged and understands the pts wishes.  Labs   CBC: Recent Labs  Lab 07/08/21 1112  WBC 32.0*  HGB 8.6*  HCT 24.7*  MCV 93.9  PLT 338    Basic Metabolic Panel: Recent Labs  Lab  07/08/21 1112  NA 121*  K 4.3  CL 93*  CO2 10*  GLUCOSE 133*  BUN 145*  CREATININE UNABLE TO REPORT DUE TO ICTERUS  CALCIUM 7.4*   GFR: CrCl cannot be calculated (This lab value cannot be used to calculate CrCl because it is not a number: UNABLE TO REPORT DUE TO ICTERUS). Recent Labs  Lab 07/08/21 1112 07/08/21 1303 07/08/21 1304  WBC 32.0*  --   --   LATICACIDVEN  --  2.1* 1.0    Liver Function Tests: Recent Labs  Lab 07/08/21 1303  AST 112*  ALT 43  ALKPHOS 260*  BILITOT 23.6*  PROT 5.6*  ALBUMIN 1.5*   Recent Labs  Lab 07/08/21 1303  LIPASE 133*   Recent Labs  Lab 07/08/21 1303  AMMONIA 59*    ABG    Component Value Date/Time   HCO3 17.9 (L) 06/18/2021 1403   ACIDBASEDEF 6.2 (H) 06/18/2021 1403   O2SAT 52.2 06/18/2021 1403     Coagulation Profile: Recent Labs  Lab 07/08/21 1303  INR 1.8*    Cardiac Enzymes: No results for input(s): CKTOTAL, CKMB, CKMBINDEX, TROPONINI in the last 168 hours.  HbA1C: No results found for: HGBA1C  CBG: No results for input(s): GLUCAP in the last 168 hours.  Review of Systems: Positives in BOLD   Gen: fever, chills, weight change, fatigue, poor po intake, night sweats HEENT: Denies blurred vision, double vision, hearing loss, tinnitus, sinus congestion, rhinorrhea, sore throat, neck stiffness, dysphagia PULM: Denies shortness of breath, cough, sputum production, hemoptysis, wheezing CV: Denies chest pain, edema, orthopnea, paroxysmal nocturnal dyspnea, palpitations GI: abdominal pain/tightness, nausea, vomiting, diarrhea, hematochezia, melena, constipation, change in bowel habits GU: Denies dysuria, hematuria, polyuria, oliguria, urethral discharge Endocrine: Denies hot or cold intolerance, polyuria, polyphagia or appetite change Derm: Denies rash, dry skin, scaling or peeling skin change Heme: Denies easy bruising, bleeding, bleeding gums Neuro: Denies headache, numbness, weakness, slurred speech, loss of  memory or consciousness   Past Medical History:  He,  has no past medical history on file.   Surgical History:   Past Surgical History:  Procedure Laterality Date   ESOPHAGOGASTRODUODENOSCOPY N/A 07/01/2021   Procedure: ESOPHAGOGASTRODUODENOSCOPY (EGD);  Surgeon: Annamaria Helling, DO;  Location: [facility] ENDOSCOPY;  Service: Gastroenterology;  Laterality: N/A;     Social History:   reports that he quit smoking about 4 years ago. His smoking use included cigarettes. He has never used smokeless tobacco. He reports current alcohol use. He reports that he does not use drugs.   Family History:  His family history is not on file.  Allergies No Known Allergies   Home Medications  Prior to Admission medications   Medication Sig Start Date End Date Taking? Authorizing Provider  folic acid (FOLVITE) 1 MG tablet Take 1 tablet (1 mg total) by mouth daily. 06/01/21  Yes Nicole Kindred A, DO  hydrOXYzine (ATARAX) 25 MG tablet Take 25 mg by mouth 3 (three) times daily as needed. 06/26/21  Yes [provider]  lactulose (CHRONULAC) 10 GM/15ML solution Take 45 mLs (30 g total) by mouth 2 (two) times daily for 21 days. 06/19/21 07/10/21 Yes Emeterio Reeve, DO  Multiple Vitamin (MULTIVITAMIN WITH MINERALS) TABS tablet Take 1 tablet by mouth daily. 06/01/21  Yes Nicole Kindred A, DO  omeprazole (PRILOSEC OTC) 20 MG tablet Take 20 mg by mouth daily.   Yes [provider]  spironolactone (ALDACTONE) 100 MG tablet Take 1 tablet (100 mg total) by mouth daily. 06/01/21  Yes Nicole Kindred A, DO  thiamine 100 MG tablet Take 1 tablet (100 mg total) by mouth daily. 06/01/21  Yes Nicole Kindred A, DO  calcium carbonate (TUMS - DOSED IN MG ELEMENTAL CALCIUM) 500 MG chewable tablet Chew 1 tablet (200 mg of elemental calcium total) by mouth 3 (three) times daily as needed for indigestion or heartburn. Patient not taking: Reported on 06/18/2021 05/31/21   Nicole Kindred A, DO  furosemide (LASIX)  40 MG tablet Take 1 tablet (40 mg total) by mouth 2 (two) times daily. Patient not taking: Reported on 07/08/2021 06/19/21   Emeterio Reeve, DO  pantoprazole (PROTONIX) 40 MG tablet Take 1 tablet (40 mg total) by mouth daily for 14 days. 12/05/20 12/19/20  Duffy Bruce, MD     Critical care time: 55 minutes     Donell Beers, [place] Pager [phone] (please enter 7 digits) PCCM Consult Pager [phone] (please enter 7 digits)

## 2021-07-08 NOTE — Consult Note (Signed)
Pharmacy Antibiotic Note  Anthony Torres is a 41 y.o. male admitted on 07/08/2021 with sepsis, weakness,fever  Pharmacy has been consulted for Vancomycin and Cefepime dosing.  Plan: After discussion with Harlon Ditty, antibiotics Lynda be changed Vancomycin and Zosyn due to less renal function dosing variation with zosyn than cefepime.  Vancomycin 2000 mg LD x 1, then dose by levels due to unknown creatinine clearance ISO elevated bilirubin  Zosyn 3.375 gram q 8 hours ( 4-hour infusion)  Height: 5\' 10"  (177.8 cm) Weight: 97 kg (213 lb 13.5 oz) IBW/kg (Calculated) : 73  Temp (24hrs), Avg:97.5 F (36.4 C), Min:97.4 F (36.3 C), Max:97.5 F (36.4 C)  Recent Labs  Lab 07/08/21 1112 07/08/21 1303 07/08/21 1304 07/08/21 1844  WBC 32.0*  --   --   --   CREATININE UNABLE TO REPORT DUE TO ICTERUS  --   --  UNABLE TO REPORT DUE TO ICTERUS  LATICACIDVEN  --  2.1* 1.0  --     CrCl cannot be calculated (This lab value cannot be used to calculate CrCl because it is not a number: UNABLE TO REPORT DUE TO ICTERUS).    No Known Allergies  Antimicrobials this admission: 5/29 metronidazole x 1  5/29 cefepime x 1   5/29 Vancomycin >> 5/29 Zosyn >>   Dose adjustments this admission:   Microbiology results: 5/29 BCx: sent 5/29 Peritoneal fluid: sent 5/29 GI panel: ordered 5/29 C. Diff panel: ordered  5/29 UCx: sent 5/29 RVP: sent    Thank you for allowing pharmacy to be a part of this patient's care.  6/29, PharmD, BCPS Clinical Pharmacist   07/08/2021 8:10 PM

## 2021-07-08 NOTE — Consult Note (Signed)
Inpatient Consultation   Patient ID: Anthony Torres is a 41 y.o. male.  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  GI is consulted for cirrhosis.  Patient did not been feeling well for a couple of weeks with poor p.o. intake and reported abdominal distention.  He noted generalized weakness, fatigue, fevers, chills in more recent days.  He was recently seen in the office on May 17 with referrals pending for liver transplant evaluation.  He has been sober since his hospitalization in April 2023 when he was initially diagnosed with cirrhosis and alcoholic hepatitis at that time.  A recent phosphatidyl ethanol screen has also been negative.  His alcoholic hepatitis in April did not improve with steroids and were discontinued.  Since then he has not been able to tolerate beta-blocker or diuretics due to weakness, lightheadedness.  He was having up to 12 bowel movements per day while he was on his lactulose which caused him to stop at last week but still reports 7-8 bowel movements daily without melena or hematochezia.  He denies any hematemesis or coffee-ground emesis.  Remains jaundiced from previous admission. He denies any alcohol or tobacco use at this time.  Pt denies any cough of shortness of breath. No dysuria. He notes chest discomfort with exertion.  Denies true abdominal pain but feels uncomfortable. Paracentesis is pending.  Hypotensive on presentation and pt was given cefepime, flagyl, vancomycin, and 2L NS bolus in ED  Abdominal ultrasound and CT only demonstrated mild ascites along with  known cirrhotic morphology. Raised concerns for cholecystitis, however, this is less likely as pt has no ruq pain and likely a reflection of his concurrent cirrhosis. MRCP also negative in April.  Chest x-ray was negative.  White count notably 32, hemoglobin stable at 8.6, bilirubin improving down to 23.5 (from April hospitalization with etoh hepatitis) AST 106 ALT 48 albumin less than 1.5, creatinine is unable to be measured due to hyperbilirubinemia.  Most recent creatinine was 2.3.  Today's BUN is 145.  Sodium 121. INR 1.8 Lactic acid was initially 2.1 but has now improved.  Procalcitonin negative.  Tylenol level negative.  Blood cultures are pending Covid negative, respiratory panel negative.  Previous viral hepatitis panel and HIV were negative  Prior to alcohol cessation he was drinking for 10 years approximately 8-10 twelve ounce beers per day with additional 7-8 shots of liquor.  He had recently cut back prior to the hospitalization drinking 1 mixed drink per day but with 3 fingers of liquor in each glass.  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  He was awaiting evaluation by interventional radiology/vascular surgery for BRTO.  Swelling in his legs has improved. No withdrawal sx or seizures.  Denies NSAIDs, Anti-plt agents, and anticoagulants Denies family history of gastrointestinal disease and malignancy Previous Endoscopies: no colonoscopy EGD 07/01/21- Three less than 1 mm angioectasias with stigmata of recent bleeding were found in the second portion of  the duodenum. Coagulation for hemostasis using argon plasma was successful. Estimated blood loss was minimal. Findings: 2nd Portion of the Duodenum : Angioectasia The ampulla, duodenal bulb and first portion of the duodenum were normal. Estimated blood  loss: none. Severe portal hypertensive gastropathy was found in the entire examined stomach. Estimated blood loss: none. Type 1 isolated gastric varices (IGV1, varices located in the fundus) with no bleeding were found in the cardia. There were no stigmata of recent bleeding. Estimated blood loss: none. : Varices The Z-line was variable. Estimated blood loss: none. Esophagogastric landmarks were identified: the gastroesophageal junction was found at 36 cm from the incisors. Grade I varices were found in the lower third of the esophagus. Estimated blood loss: none. Diffuse Yellowing mucosal changes were found in the entire esophagus. Estimated blood loss: none.   PMHX: Cirrhosis- decompensated 2/2 alcohol Portal htn Alcohol abuse- in remission Alcohol hepatitis  Past Surgical History:  Procedure Laterality Date   ESOPHAGOGASTRODUODENOSCOPY N/A 07/01/2021   Procedure: ESOPHAGOGASTRODUODENOSCOPY (EGD);  Surgeon: [name], DO;  Location: [facility] ENDOSCOPY;  Service: Gastroenterology;  Laterality: N/A;    No Known Allergies  No family history on file.  Social History   Tobacco Use   Smoking status: Former    Types: Cigarettes    Quit date: 2019    Years since quitting: 4.4   Smokeless tobacco: Never  Vaping Use   Vaping Use: Never used  Substance Use Topics   Alcohol use: Yes   Drug use: Never     Pertinent GI related history and allergies were reviewed with the patient  Review of Systems  Constitutional:  Positive for activity change, appetite change, chills, fatigue and fever. Negative for diaphoresis and unexpected weight change.  HENT:  Negative for trouble swallowing and voice change.   Respiratory:  Negative for shortness of breath and wheezing.   Cardiovascular:  Positive for chest pain (with exertion). Negative for palpitations and leg swelling.  Gastrointestinal:  Positive for abdominal distention, diarrhea and nausea. Negative for abdominal pain, anal  bleeding, blood in stool, constipation and vomiting.  Musculoskeletal:  Negative for arthralgias and myalgias.  Skin:  Positive for color change. Negative for pallor.  Neurological:  Positive for weakness. Negative for dizziness and syncope.  Psychiatric/Behavioral:  Negative for confusion. The patient is not nervous/anxious.   All other systems reviewed and are negative.   Medications Home Medications No current facility-administered medications on file prior to encounter.   Current Outpatient Medications on File Prior to Encounter  Medication Sig Dispense Refill   folic acid (FOLVITE) 1 MG tablet Take 1 tablet (1 mg total) by mouth daily. 30 tablet 2   hydrOXYzine (ATARAX) 25 MG tablet Take 25 mg by mouth 3 (three) times daily as needed.     lactulose (CHRONULAC) 10 GM/15ML solution Take 45 mLs (30 g total) by mouth 2 (two) times daily for 21 days. 1892 mL 0   Multiple Vitamin (MULTIVITAMIN WITH MINERALS) TABS tablet Take 1 tablet by mouth daily.     omeprazole (PRILOSEC OTC) 20 MG tablet Take 20 mg by mouth daily.     spironolactone (ALDACTONE) 100 MG tablet Take 1 tablet (100 mg total) by mouth daily. 30 tablet 2   thiamine 100 MG tablet Take 1 tablet (100 mg total) by mouth daily. 30 tablet 2   calcium carbonate (TUMS - DOSED IN MG ELEMENTAL CALCIUM) 500 MG chewable tablet Chew 1 tablet (200 mg of elemental calcium total) by mouth 3 (three) times  daily as needed for indigestion or heartburn. (Patient not taking: Reported on 06/18/2021)     furosemide (LASIX) 40 MG tablet Take 1 tablet (40 mg total) by mouth 2 (two) times daily. (Patient not taking: Reported on 07/08/2021) 60 tablet 0   pantoprazole (PROTONIX) 40 MG tablet Take 1 tablet (40 mg total) by mouth daily for 14 days. 14 tablet 0   Pertinent GI related medications were reviewed with the patient  Inpatient Medications  Current Facility-Administered Medications:    0.9 %  sodium chloride infusion, , Intravenous, Continuous,  Anderson, Chelsey L, MD   0.9 %  sodium chloride infusion, 250 mL, Intravenous, Continuous, Meda Coffee, Dreama Saa, NP   albumin human 25 % solution 25 g, 25 g, Intravenous, Q6H, Teressa Lower, NP   [START ON 07/09/2021] Chlorhexidine Gluconate Cloth 2 % PADS 6 each, 6 each, Topical, Q0600, Richarda Osmond, MD, 6 each at 07/08/21 1828   [START ON 07/09/2021] metroNIDAZOLE (FLAGYL) IVPB 500 mg, 500 mg, Intravenous, Q12H, Teressa Lower, NP   midodrine (PROAMATINE) tablet 10 mg, 10 mg, Oral, TID WC, Darel Hong D, NP   norepinephrine (LEVOPHED) 4mg  in 288mL (0.016 mg/mL) premix infusion, 2-10 mcg/min, Intravenous, Titrated, Teressa Lower, NP   octreotide (SANDOSTATIN) 2 mcg/mL load via infusion 50 mcg, 50 mcg, Intravenous, Once **AND** octreotide (SANDOSTATIN) 500 mcg in sodium chloride 0.9 % 250 mL (2 mcg/mL) infusion, 50 mcg/hr, Intravenous, Continuous, Meda Coffee, Dreama Saa, NP   pantoprazole (PROTONIX) injection 40 mg, 40 mg, Intravenous, Q24H, Teressa Lower, NP   rifaximin Doreene Nest) tablet 550 mg, 550 mg, Oral, BID, Darel Hong D, NP   sodium bicarbonate 150 mEq in sterile water 1,150 mL infusion, , Intravenous, Continuous, Teressa Lower, NP  sodium chloride     sodium chloride     albumin human     [START ON 07/09/2021] metronidazole     norepinephrine (LEVOPHED) Adult infusion     octreotide  (SANDOSTATIN)    IV infusion      sodium bicarbonate (isotonic) infusion in sterile water         Objective   Vitals:   07/08/21 1600 07/08/21 1745 07/08/21 1830 07/08/21 1841  BP: (!) 97/52 (!) 97/48 (!) 99/58   Pulse: 89 88 87   Resp: 17 15 (!) 22   Temp:  (!) 97.4 F (36.3 C) (!) 97.5 F (36.4 C)   TempSrc:  Oral Oral   SpO2: 97% 98% 96% 96%  Weight:      Height:   5\' 10"  (1.778 m)      Physical Exam Vitals and nursing note reviewed.  Constitutional:      General: He is not in acute distress.    Appearance: He is ill-appearing and toxic-appearing. He is not diaphoretic.  HENT:      Head: Normocephalic and atraumatic.     Nose: Nose normal.     Mouth/Throat:     Mouth: Mucous membranes are moist.     Pharynx: Oropharynx is clear.     Comments: Sublingual jaundice Eyes:     General: Scleral icterus present.     Comments: R eye prosthesis  Cardiovascular:     Rate and Rhythm: Regular rhythm.     Heart sounds: No murmur heard.   No friction rub. No gallop.     Comments: hypotensive Pulmonary:     Effort: No respiratory distress.     Breath sounds: No wheezing, rhonchi or rales.     Comments: tachypneic  Abdominal:     General: Bowel sounds are normal. There is distension.     Palpations: Abdomen is soft.     Tenderness: There is no abdominal tenderness. There is no guarding or rebound.  Musculoskeletal:     Cervical back: Neck supple.     Right lower leg: Edema present.     Left lower leg: Edema present.  Skin:    Coloration: Skin is jaundiced.  Neurological:     Mental Status: He is alert and oriented to person, place, and time.     Comments: Asterixis present  Psychiatric:        Thought Content: Thought content normal.    Laboratory Data Recent Labs  Lab 07/08/21 1112  WBC 32.0*  HGB 8.6*  HCT 24.7*  PLT 324   Recent Labs  Lab 07/08/21 1112 07/08/21 1303  NA 121*  --   K 4.3  --   CL 93*  --   CO2 10*  --   BUN 145*  --   CALCIUM 7.4*  --   PROT  --  5.6*  BILITOT  --  23.6*  ALKPHOS  --  260*  ALT  --  43  AST  --  112*  GLUCOSE 133*  --    Recent Labs  Lab 07/08/21 1303  INR 1.8*    Recent Labs    07/08/21 1303  LIPASE 133*        Imaging Studies: CT ABDOMEN PELVIS WO CONTRAST  Result Date: 07/08/2021 CLINICAL DATA:  Alcoholic cirrhosis, worsening weakness, malaise, fever and chills, nausea and vomiting EXAM: CT ABDOMEN AND PELVIS WITHOUT CONTRAST TECHNIQUE: Multidetector CT imaging of the abdomen and pelvis was performed following the standard protocol without IV contrast. RADIATION DOSE REDUCTION: This exam was  performed according to the departmental dose-optimization program which includes automated exposure control, adjustment of the mA and/or kV according to patient size and/or use of iterative reconstruction technique. COMPARISON:  07/08/2021, 05/26/2021 FINDINGS: Lower chest: There are dependent areas of atelectasis within the lower lobes. Superimposed ground-glass opacities are seen elsewhere at the lung bases, and could reflect hypoventilatory changes, edema, or atypical viral pneumonia. Hepatobiliary: Nodular contour of the liver capsule consistent with cirrhosis. No focal parenchymal abnormality seen on this unenhanced exam. Calcified gallstones are identified, with no gallbladder wall thickening or pericholecystic fluid by CT. Please refer to prior ultrasound imaging describing equivocal changes of cholecystitis. Pancreas: Unremarkable unenhanced appearance. Spleen: Stable splenomegaly consistent with portal venous hypertension. No focal abnormalities on this unenhanced exam. Adrenals/Urinary Tract: No urinary tract calculi or obstructive uropathy within either kidney. The adrenals and bladder are grossly normal. Stomach/Bowel: No bowel obstruction or ileus. Normal appendix right lower quadrant. There is nonspecific diffuse wall thickening of the jejunum as well as the ascending colon, likely due to adjacent ascites and hypoproteinemic state given underlying cirrhosis. Vascular/Lymphatic: There is recanalization of the umbilical vein, as well as numerous varices at the splenic hilum and surrounding the stomach, compatible with portal venous hypertension. Evaluation of the vascular lumen is limited without IV contrast. No pathologic adenopathy. Reproductive: Prostate is unremarkable. Other: Small volume ascites, though increased in size since prior study. No free intraperitoneal gas. No abdominal wall hernia. Musculoskeletal: No acute or destructive bony lesions. Reconstructed images demonstrate no additional  findings. IMPRESSION: 1. Cirrhosis, with portal venous hypertension manifested by splenomegaly, increasing ascites, and multiple varices throughout the abdomen. 2. Cholelithiasis without CT evidence for acute cholecystitis. Please refer to preceding ultrasound evaluation. 3. Dependent  lower lobe atelectasis, with nonspecific bibasilar ground-glass opacities. Differential would include hypoventilatory change, edema, or atypical viral pneumonia. 4. Nonspecific bowel wall thickening, likely due to ascites and underlying hypoproteinemic state. Electronically Signed   By: Randa Ngo M.D.   On: 07/08/2021 18:34   DG Chest Port 1 View  Result Date: 07/08/2021 CLINICAL DATA:  Increasing weakness over the last couple of days. History of liver failure. Questionable sepsis. EXAM: PORTABLE CHEST 1 VIEW COMPARISON:  12/05/2020. FINDINGS: Cardiac silhouette is normal in size. No mediastinal or hilar masses. Mild opacities the lung bases consistent with atelectasis. Remainder of the lungs is clear. No pleural effusion.  No pneumothorax. Skeletal structures are grossly intact. IMPRESSION: No acute cardiopulmonary disease. Electronically Signed   By: Lajean Manes M.D.   On: 07/08/2021 14:08   US ABDOMEN LIMITED RUQ (LIVER/GB)  Result Date: 07/08/2021 CLINICAL DATA:  Septic EXAM: ULTRASOUND ABDOMEN LIMITED RIGHT UPPER QUADRANT COMPARISON:  CT abdomen pelvis 05/26/2021 FINDINGS: Gallbladder: Gallstones: Mild cholelithiasis. Sludge: Present. Gallbladder Wall: Mild gallbladder wall thickening measuring up to 5 mm. Pericholecystic fluid: None Sonographic Murphy's Sign: Negative per technologist Common bile duct: Diameter: 3 mm Liver: Parenchymal echogenicity: Within normal limits Contours: Mildly nodular. Lesions: None Portal vein: Hepatofugal flow. Other: Mild ascites. IMPRESSION: 1. Cholelithiasis and gallbladder wall thickening. Findings may be the result of acute cholecystitis. Further evaluation HIDA scan would be  beneficial. 2. Cirrhotic liver morphology with reversed portal vein flow consistent with portal hypertension. No focal hepatic lesion. Electronically Signed   By: Miachel Roux M.D.   On: 07/08/2021 12:54    Assessment:  # Decompensated Cirrhosis-  -MELD-Na- 36;  unable to calculate utd creatinine due to bilirubin elevation (using old creatinine from 5/17 was 2.3) - CP- C (12) - no thrombosis on [place] - no lesion on [place] - ama and viral hepatitis, hiv negative; asma result not found- Zen order  # Sepsis- unknown source at this time - paracentesis pending - covid negative - cxr and pro cal negative - u/a negative for uti - blood cx pending - LA downtrending from 2.1 to 1.0  # Probable AKI- concern for HRS1 - cannot measure creatinine due to hyperbilirubinemia, but BUN elevated to 145 - pt still making urine  # Cholestatic hepatitis - r factor 0.5 (alt 48, ap [id number])  # metabolic acidosis c likely compensatory respiratory alkalosis- bicarb 12; pH 7.28 with pCO2 22 on vbg  # Portal HTN - esophageal varix (f1); gastric varix (isolated); severe phg with oozing; duodenal avm (s/p apc); hyponatremia, hypoalbuminemia, hyper bili, coagulopathy, asterixis  # Uremia-  - no signs of gib; hgb at baseline  # Macrocytic anemia- with additional h/o upper gi blood loss - phg with oozing - isolated gastric varix on egd 07/01/21 - f1 esophageal varix on 07/01/21 with no stigmata of bleeding  # h/o etoh abuse- sober since April; peth negative 06/26/21; alcohol negative 5/29 upon hospitalization # Alcoholic hepatitis- in April- bilirubin down trending; did not respond to steroids at that time.    Plan:  Have placed call to [facility] Trans[facility] Hepatologist on call and awaiting call back  Abx as per primary team- has rcd cefepime/vancomycin; now on Zosyn Octreotide bolus and gtt Midodrine 10 mg tid Albumin 25g q6 h for 3 days Strict I&O; Monitor UOP; urine sodium and osm pending Continue xifaxin 550mg   bid; if <3 BM per day- restart lactulose at 10gm q8h. Hold for diarrhea Pressors (levophed) as needed to keep map >60 R/o infectious diarrhea  given stool burden off of lactulose for >1week Monitor for worsening encephalopathy Diuretics and beta blockers have been held Hold dvt ppx Redraw phosphatidyl ethanol Paracentesis pending with studies Avoid sedating and hepatotoxic medications Additional iv access to be placed Monitor cmp, cbc, inr closely Monitor for signs of GIB Electrolyte correction as per primary team Supportive care as per primary team  Patient condition is critical and prognosis is guarded at this time. This was communicated to the patient who verbalized understanding. He would like to remain full code at this time, however, does not want long term options like trach or peg.  I personally performed the service.  Management of other medical comorbidities as per primary team  Thank you for allowing [place] to participate in this patient's care. Please don't hesitate to call if any questions or concerns arise.   Annamaria Helling, DO [facility]  Portions of the record may have been created with voice recognition software. Occasional wrong-word or 'sound-a-like' substitutions may have occurred due to the inherent limitations of voice recognition software.  Read the chart carefully and recognize, using context, where substitutions may have occurred.

## 2021-07-08 NOTE — Progress Notes (Signed)
VAST consult received to obtain USGIV for pressor administration. Upon arrival at bedside patient is not receiving anything via IV. Spoke with patient's nurse who stated they are holding off for now on pressors. If circumstances change, RN Jese place consult for VAST RN to return and place USGIV.

## 2021-07-08 NOTE — Progress Notes (Signed)
Received call from medical ICU attending Dr. Haze Justin, who confirms [facility] has accepted pt for transfer.  Bed is currently pending.  [facility]ie reach back out once bed available.  Updated pt's sister Leanord Hawking of pending transfer.   Harlon Ditty, AGACNP-BC [facility] Prefer epic messenger for cross cover needs If after hours, please call E-link

## 2021-07-08 NOTE — Sepsis Progress Note (Addendum)
Notified provider and bedside nurse of need to order and administer fluid bolus.   BPs marginal, on the soft side, and 1st Lactic acid 2.1, message sent to provider and bedside RN asking if any fluid bolus would be given. MD placed order for 2Liters

## 2021-07-08 NOTE — Discharge Summary (Signed)
Physician Discharge Summary  Patient ID: Anthony Torres MRN: [id number] DOB/AGE: 03-29-1980 41 y.o.  Admit date: 07/08/2021 Discharge date: 07/08/2021   Brief Pt Description / Synopsis:  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 possible atypical viral pneumonia), AKI & Uremia (suspect Hepatorenal syndrome), hyponatremia, and anion gap metabolic acidosis.    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 possible atypical viral pneumonia) Anion gap metabolic acidosis Hyponatremia Acute Kidney Injury, suspect Hepatorenal syndrome Uremia Chronic Macrocytic anemia Thrombocytopenia                                                            Discharge Summary:  This is a 41 yo male with a hx of alcoholic cirrhosis and hepatitis who presented to [facility] ER on 05/29 with c/o worsening weakness, malaise, fevers, and chills onset of symptoms several days prior to presentation.  He also reports poor po intake with some abdominal distension and tightness.  He recently underwent an upper endoscopy performed by Dr. Virgina Jock on 07/01/21 for evaluation of macrocytic anemia which revealed portal hypertensive gastropathy, type 1 isolated gastric varices located in the fundus without bleeding; grade I esophageal varices; and mucosal changes in the esophagus.  Recommendation at that time was medication management; repeat upper endoscopy in 6 months; follow-up with outpatient GI; and referral to hepatology.     ED Course In the ER lab results revealed Na+ 121, chloride 93, CO2 10, glucose 133, BUN 145, unable to calculate creatinine, calcium 7.4, anion gap 18, alk phos 260, albumin 1.5, lipase 133, AST 112, total protein 5.6, ammonia 59, total bilirubin 23.6, lactic acid  2.1, wbc 32.0, hgb 8.6, PT 21.0, and INR 1.8.  CXR and COVID-19 negative.  Pt also hypotensive.  Sepsis protocol initiated pt received cefepime, flagyl, vancomycin, and 2L NS bolus.  [place] Abd Limited RUQ concerning for cirrhotic liver morphology with portal hypertension; mild ascites; and cholelithiasis with gallbladder wall thickening possibly due to acute cholecystitis with recommendation for HIDA scan for further evaluation.     ED provider discussed case with on call gastroenterologist Dr. Virgina Jock who recommended transfer to [facility] or [facility] for hepatology evaluation due to pts high MELD score (in the mid to high 30's).  Dr. Virgina Jock also recommended against proceeding with HIDA scan due to its low utility in the setting of pts significantly elevated bilirubin.  ER provider attempted to transfer pt to [facility], however both facilities are on diversion per ER notes.  ED provider also discussed the case with General Surgeon Dr. Christian Mate due to US findings of cholelithiasis with possible cholecystitis.  However, Dr. Christian Mate deemed pt a poor operative candidate due to high MELD score and recommended medical management.  ER provider contacted hospitalist team for hospital admission, and pt initially admitted to the stepdown unit for additional workup and treatment.  However, PCCM team consulted and due to pts high acuity status changed to ICU and PCCM team assumed care.      Discharge Plan by Diagnosis:   Sepsis with septic shock suspected etiology unclear although possibly secondary to possible SBP vs. possible atypical viral  pneumonia  - Trend WBC and monitor fever curve  - Trend PCT - Follow cultures  - Continue Vancomycin & Zosyn for now pending culture results  - Paracentesis is pending - Continuous telemetry monitoring  - Prn levophed gtt to maintain map >65   Severe anion gap metabolic acidosis  Severe hyponatremia likely in the setting of volume depletion due to poor po intake  Mild  lactic acidosis  Uremia Suspected AKI - Trend BMP and VBG - Replace electrolytes as indicated  - Monitor UOP - Avoid nephrotoxic medications - Urine Na+, serum osmolality, and urine osmolality pending  - Sodium bicarb gtt '@100'  ml/hr for now -Nephrology consulted, appreciate input ~ likely Naheem need HD vs CRRT   Decompensated alcoholic liver cirrhosis and hepatitis  Portal venous hypertension  Hx: Type 1 gastric varices and grade I esophageal varices  - GI consulted appreciate input: per recommendation octreotide bolus followed by octreotide gtt, 25g of iv albumin q6hrs, and [place] attempt to reach out to [facility] and/or [facility] for pt transfer  - Trend hepatic function panel and ammonia level  - Urine drug screen, tylenol level, and alcohol levels pending    Chronic macrocytic anemia  Thrombocytopenia secondary to alcoholic liver cirrhosis and hepatitis  - Monitor for s/sx of bleeding  - Transfuse for hgb <7 and/or active signs of bleeding  - Avoid chemical VTE px    Significant Events:  05/29: Pt admitted to ICU with septic shock secondary to suspected acute cholecystitis, anion gap metabolic acidosis, severe hyponatremia, and decompensated alcoholic liver cirrhosis and hepatitis.  GI & Nephrology consulted.                  Significant Diagnostic Studies:  05/29: US Abdomen Limited RUQ revealed Cholelithiasis and        gallbladder wall thickening. Findings may be the result of acute        cholecystitis. Further evaluation HIDA scan would be beneficial.       Cirrhotic liver morphology with reversed portal vein flow           consistent with portal hypertension. No focal hepatic lesion. 05/29: CT Abd Pelvis revealed Cirrhosis, with portal venous        hypertension manifested by splenomegaly, increasing ascites,        and multiple varices throughout the abdomen. Cholelithiasis          without CT evidence for acute cholecystitis. Please refer to        preceding ultrasound  evaluation. Dependent lower lobe        atelectasis, with nonspecific bibasilar ground-glass opacities.        Differential would include hypoventilatory change, edema, or        atypical viral pneumonia. Nonspecific bowel wall thickening,        likely due to ascites and underlying hypoproteinemic state.            Micro Data:  5/29: SARS-CoV-2 and influenza PCR>> negative 5/29: Blood culture x2>> 5/29: MRSA PCR>> negative 5/29: C. difficile and GI panel PCR>>  Antimicrobials:  5/29 metronidazole x 1  5/29 cefepime x 1   5/29 Vancomycin >> 5/29 Zosyn >>   Consults:  GI  Nephrology   Discharge Exam:  Examination: General: Acute on chronically ill appearing male, NAD resting in bed  HENT: Very poor dentition, no JVD  Lungs: Clear throughout, even, non labored  Cardiovascular: NSR, rrr, no r/g, 2+ radial/1+ distal pulses present, 1+ generalized edema  Abdomen:  Ascites, distended, taut, mildly tender, +BS x4 Extremities: Moves all extremities  Neuro: Alert and oriented, follows commands, right prosthetic eye; left eye scleral icturis  Skin: Severely jaundiced with generalized spider angiomas  GU: Deferred    Vitals:   07/08/21 1600 07/08/21 1745 07/08/21 1830 07/08/21 1841  BP: (!) 97/52 (!) 97/48 (!) 99/58   Pulse: 89 88 87   Resp: 17 15 (!) 22   Temp:  (!) 97.4 F (36.3 C) (!) 97.5 F (36.4 C)   TempSrc:  Oral Oral   SpO2: 97% 98% 96% 96%  Weight:      Height:   '5\' 10"'  (1.778 m)      Discharge Labs:   BMET Recent Labs  Lab 07/08/21 1112 07/08/21 1844  NA 121* 121*  K 4.3 4.3  CL 93* 97*  CO2 10* 12*  GLUCOSE 133* 114*  BUN 145* 142*  CREATININE UNABLE TO REPORT DUE TO ICTERUS UNABLE TO REPORT DUE TO ICTERUS  CALCIUM 7.4* 6.9*  PHOS  --  UNABLE TO REPORT DUE TO ICTERUS    CBC Recent Labs  Lab 07/08/21 1112  HGB 8.6*  HCT 24.7*  WBC 32.0*  PLT 324    Anti-Coagulation Recent Labs  Lab 07/08/21 1303  INR 1.8*          Allergies  as of 07/08/2021   No Known Allergies      Medication List     STOP taking these medications    calcium carbonate 500 MG chewable tablet Commonly known as: TUMS - dosed in mg elemental calcium   folic acid 1 MG tablet Commonly known as: FOLVITE   furosemide 40 MG tablet Commonly known as: LASIX   hydrOXYzine 25 MG tablet Commonly known as: ATARAX   lactulose 10 GM/15ML solution Commonly known as: CHRONULAC   multivitamin with minerals Tabs tablet   omeprazole 20 MG tablet Commonly known as: PRILOSEC OTC   pantoprazole 40 MG tablet Commonly known as: Protonix Replaced by: pantoprazole 40 MG injection   spironolactone 100 MG tablet Commonly known as: ALDACTONE   thiamine 100 MG tablet       TAKE these medications    albumin human 25 % bottle Inject 400 mLs (100 g total) into the vein once for 1 dose.   albumin human 25 % bottle Inject 400 mLs (100 g total) into the vein once for 1 dose. Start taking on: Jul 09, 2021   Chlorhexidine Gluconate Cloth 2 % Pads Apply 6 each topically daily at 6 (six) AM. Start taking on: Jul 09, 2021   midodrine 10 MG tablet Commonly known as: PROAMATINE Take 1 tablet (10 mg total) by mouth 3 (three) times daily with meals.   norepinephrine 4-5 MG/250ML-% Soln Commonly known as: LEVOPHED Inject 0-40 mcg/min into the vein continuous.   octreotide 2 mcg/mL Soln Commonly known as: SANDOSTATIN Inject 50 mcg into the vein once for 1 dose.   octreotide 500 mcg in sodium chloride 0.9 % 250 mL Inject 50 mcg/hr into the vein continuous.   pantoprazole 40 MG injection Commonly known as: PROTONIX Inject 40 mg into the vein daily. Replaces: pantoprazole 40 MG tablet   piperacillin-tazobactam 3.375 GM/50ML IVPB Commonly known as: ZOSYN Inject 50 mLs (3.375 g total) into the vein every 8 (eight) hours.   rifaximin 550 MG Tabs tablet Commonly known as: XIFAXAN Take 1 tablet (550 mg total) by mouth 2 (two) times daily.    sodium chloride 0.9 % infusion Inject 10 mLs  into the vein continuous.   sodium chloride 0.9 % infusion Inject 250 mLs into the vein continuous.            Disposition: Medical ICU  Discharged Condition: Arvind H Girardin has met maximum benefit of inpatient care at [facility] and requires transfer to tertiary center facility for higher level of care   Time spent on disposition:  50 Minutes.     Signed: Darel Hong, AGACNP-BC [facility] Prefer epic messenger for cross cover needs If after hours, please call E-link

## 2021-07-08 NOTE — Sepsis Progress Note (Signed)
Elink following code sepsis °

## 2021-07-08 NOTE — Care Plan (Signed)
Discussed case with Transplant Hepatologist at [facility] center who has agreed to accept the patient. Agrees with current plan of care. Transfer center [place] contact us back once a bed in ICU is available.  [name], DO [facility]

## 2021-07-08 NOTE — Consult Note (Signed)
PHARMACY -  BRIEF ANTIBIOTIC NOTE   Pharmacy has received consult(s) for vancomycin and cefepime from an ED provider.  The patient's profile has been reviewed for ht/wt/allergies/indication/available labs.    One time order(s) placed for cefepime 2 g and vancomycin 2 g IV   Further antibiotics/pharmacy consults should be ordered by admitting physician if indicated.                       Thank you, Derrek Gu, PharmD 07/08/2021  12:23 PM

## 2021-07-08 NOTE — Consult Note (Signed)
CODE SEPSIS - PHARMACY COMMUNICATION  **Broad Spectrum Antibiotics should be administered within 1 hour of Sepsis diagnosis**  Time Code Sepsis Called/Page Received: 1220  Antibiotics Ordered: 1220  Time of 1st antibiotic administration: 1316  Additional action taken by pharmacy: n/a  If necessary, Name of Provider/Nurse Contacted: n/a    Derrek Gu ,PharmD Clinical Pharmacist  07/08/2021  12:23 PM

## 2021-07-08 NOTE — ED Notes (Signed)
Attempted to call report to ICU; placed on hold with no answer.

## 2021-07-09 ENCOUNTER — Other Ambulatory Visit: Payer: Self-pay | Admitting: Gastroenterology

## 2021-07-09 DIAGNOSIS — I864 Gastric varices: Secondary | ICD-10-CM

## 2021-07-09 DIAGNOSIS — K7200 Acute and subacute hepatic failure without coma: Secondary | ICD-10-CM | POA: Diagnosis not present

## 2021-07-09 LAB — URINALYSIS, COMPLETE (UACMP) WITH MICROSCOPIC
Bacteria, UA: NONE SEEN
Glucose, UA: NEGATIVE mg/dL
Hgb urine dipstick: NEGATIVE
Ketones, ur: NEGATIVE mg/dL
Leukocytes,Ua: NEGATIVE
Nitrite: NEGATIVE
Protein, ur: NEGATIVE mg/dL
Specific Gravity, Urine: 1.012 (ref 1.005–1.030)
pH: 5 (ref 5.0–8.0)

## 2021-07-09 LAB — COMPREHENSIVE METABOLIC PANEL
ALT: 41 U/L (ref 0–44)
AST: 94 U/L — ABNORMAL HIGH (ref 15–41)
Albumin: <1.5 g/dL — ABNORMAL LOW (ref 3.5–5.0)
Alkaline Phosphatase: 222 U/L — ABNORMAL HIGH (ref 38–126)
Anion gap: 14 (ref 5–15)
BUN: 143 mg/dL — ABNORMAL HIGH (ref 6–20)
CO2: 11 mmol/L — ABNORMAL LOW (ref 22–32)
Calcium: 6.3 mg/dL — CL (ref 8.9–10.3)
Chloride: 100 mmol/L (ref 98–111)
Creatinine, Ser: 6.02 mg/dL — ABNORMAL HIGH (ref 0.61–1.24)
GFR, Estimated: 11 mL/min — ABNORMAL LOW (ref >60)
Glucose, Bld: 103 mg/dL — ABNORMAL HIGH (ref 70–99)
Potassium: 4 mmol/L (ref 3.5–5.1)
Sodium: 125 mmol/L — ABNORMAL LOW (ref 135–145)
Total Bilirubin: 19.4 mg/dL (ref 0.3–1.2)
Total Protein: 4.7 g/dL — ABNORMAL LOW (ref 6.5–8.1)

## 2021-07-09 LAB — URINE DRUG SCREEN, QUALITATIVE (ARMC ONLY)
Amphetamines, Ur Screen: NOT DETECTED
Barbiturates, Ur Screen: NOT DETECTED
Benzodiazepine, Ur Scrn: NOT DETECTED
Cannabinoid 50 Ng, Ur ~~LOC~~: NOT DETECTED
Cocaine Metabolite,Ur ~~LOC~~: NOT DETECTED
MDMA (Ecstasy)Ur Screen: NOT DETECTED
Methadone Scn, Ur: NOT DETECTED
Opiate, Ur Screen: NOT DETECTED
Phencyclidine (PCP) Ur S: NOT DETECTED
Tricyclic, Ur Screen: NOT DETECTED

## 2021-07-09 LAB — GASTROINTESTINAL PANEL BY PCR, STOOL (REPLACES STOOL CULTURE)

## 2021-07-09 LAB — GLUCOSE, CAPILLARY: Glucose-Capillary: 110 mg/dL — ABNORMAL HIGH (ref 70–99)

## 2021-07-09 LAB — SODIUM, URINE, RANDOM: Sodium, Ur: <10 mmol/L

## 2021-07-09 LAB — VANCOMYCIN, RANDOM: Vancomycin Rm: 23

## 2021-07-09 LAB — CBC
HCT: 19.2 % — ABNORMAL LOW (ref 39.0–52.0)
Hemoglobin: 7 g/dL — ABNORMAL LOW (ref 13.0–17.0)
MCH: 33.2 pg (ref 26.0–34.0)
MCHC: 36.5 g/dL — ABNORMAL HIGH (ref 30.0–36.0)
MCV: 91 fL (ref 80.0–100.0)
Platelets: 262 10*3/uL (ref 150–400)
RBC: 2.11 MIL/uL — ABNORMAL LOW (ref 4.22–5.81)
RDW: 13.2 % (ref 11.5–15.5)
WBC: 29.7 10*3/uL — ABNORMAL HIGH (ref 4.0–10.5)
nRBC: 0 % (ref 0.0–0.2)

## 2021-07-09 LAB — CLOSTRIDIUM DIFFICILE BY PCR, REFLEXED: Toxigenic C. Difficile by PCR: POSITIVE — AB

## 2021-07-09 LAB — OSMOLALITY: Osmolality: 313 mOsm/kg — ABNORMAL HIGH (ref 275–295)

## 2021-07-09 LAB — C DIFFICILE QUICK SCREEN W PCR REFLEX
C Diff antigen: POSITIVE — AB
C Diff toxin: NEGATIVE

## 2021-07-09 LAB — OSMOLALITY, URINE: Osmolality, Ur: 342 mOsm/kg (ref 300–900)

## 2021-07-09 LAB — PROCALCITONIN: Procalcitonin: 0.31 ng/mL

## 2021-07-09 MED ORDER — HEPARIN SODIUM (PORCINE) 1000 UNIT/ML DIALYSIS
1000.0000 [IU] | INTRAMUSCULAR | Status: DC | PRN
  Filled 2021-07-09: qty 6

## 2021-07-09 MED ORDER — NOREPINEPHRINE 16 MG/250ML-% IV SOLN
2.0000 ug/min | INTRAVENOUS | Status: DC
  Administered 2021-07-09: 2 ug/min via INTRAVENOUS
  Filled 2021-07-09: qty 250

## 2021-07-09 MED ORDER — NOREPINEPHRINE 16 MG/250ML-% IV SOLN
2.0000 ug/min | INTRAVENOUS | Status: AC
Start: 2021-07-09 — End: ?

## 2021-07-09 MED ORDER — FENTANYL CITRATE PF 50 MCG/ML IJ SOSY
12.5000 ug | PREFILLED_SYRINGE | Freq: Once | INTRAMUSCULAR | Status: AC
  Administered 2021-07-09: 12.5 ug via INTRAVENOUS
  Filled 2021-07-09: qty 1

## 2021-07-09 NOTE — Progress Notes (Signed)
Report called to [place] at [facility]. Pt assessed prior to transfer.

## 2021-07-09 NOTE — Progress Notes (Signed)
Pt was transferred to [facility] this morning. Unable to see prior to departure. Annamaria Helling, DO [facility]

## 2021-07-09 NOTE — Progress Notes (Signed)
NAMEHAYDON Torres, MRN:  [id number], DOB:  10/25/1980, LOS: 1 ADMISSION DATE:  07/08/2021, CONSULTATION DATE: 07/08/2021 REFERRING MD: Dr. Ouida Sills, CHIEF COMPLAINT: Weakness   History of Present Illness:  This is a 41 yo male with a hx of alcoholic cirrhosis and hepatitis who presented to [facility] ER on 05/29 with c/o worsening weakness, malaise, fevers, and chills onset of symptoms several days prior to presentation.  He also reports poor po intake with some abdominal distension and tightness.  He recently underwent an upper endoscopy performed by Dr. Virgina Jock on 07/01/21 for evaluation of macrocytic anemia which revealed portal hypertensive gastropathy, type 1 isolated gastric varices located in the fundus without bleeding; grade I esophageal varices; and mucosal changes in the esophagus.  Recommendation at that time was medication management; repeat upper endoscopy in 6 months; follow-up with outpatient GI; and referral to hepatology.    ED Course In the ER lab results revealed Na+ 121, chloride 93, CO2 10, glucose 133, BUN 145, unable to calculate creatinine, calcium 7.4, anion gap 18, alk phos 260, albumin 1.5, lipase 133, AST 112, total protein 5.6, ammonia 59, total bilirubin 23.6, lactic acid 2.1, wbc 32.0, hgb 8.6, PT 21.0, and INR 1.8.  CXR and COVID-19 negative.  Pt also hypotensive.  Sepsis protocol initiated pt received cefepime, flagyl, vancomycin, and 2L NS bolus.  [place] Abd Limited RUQ concerning for cirrhotic liver morphology with portal hypertension; mild ascites; and cholelithiasis with gallbladder wall thickening possibly due to acute cholecystitis with recommendation for HIDA scan for further evaluation.    ED provider discussed case with on call gastroenterologist Dr. Virgina Jock who recommended transfer to [facility] or [facility] for hepatology evaluation due to pts high MELD score (in the mid to high 30's).  Dr. Virgina Jock also recommended against proceeding with HIDA scan due to its low utility in the  setting of pts significantly elevated bilirubin.  ER provider attempted to transfer pt to [place], however both facilities are on diversion per ER notes.  ED provider also discussed the case with General Surgeon Dr. Christian Mate due to US findings of cholelithiasis with possible cholecystitis.  However, Dr. Christian Mate deemed pt a poor operative candidate due to high MELD score and recommended medical management.  ER provider contacted hospitalist team for hospital admission, and pt initially admitted to the stepdown unit for additional workup and treatment.  However, PCCM team consulted and due to pts high acuity status changed to ICU and PCCM team assumed care.     07/09/21-patient seen and examined at bedside prior to transfer.  He is calm in no distress more lucid than on arrival yesterday, remains in shock physiology requiring Levophed as well as midodrine p.o. and every 6 hours albumin infusion with maps average around 70.  C. difficile work-up with negative toxin positive antigen overnight.  Admits to drinking less than 1 month ago which technically precludes liver transplant but would benefit from evaluation for possible transplant in the next several months.  Liver synthetic function is impaired and currently is being treated for sepsis suspecting possible SBP versus pneumonia due to abnormal CT abdomen with lung windows showing GGO's bilaterally.  Status post empiric cefepime and Vanco transition to Zosyn and also receiving rifaximin per GI due to alcoholic cirrhosis with portal hypertensive disease.  Central line and HD trialysis catheter placed overnight due to severe renal failure with likely hepatorenal syndrome.  Patient is on octreotide and midodrine.  At this time patient is acceptably stable for  transfer.  Pertinent  Medical History  Alcoholic Cirrhosis and Hepatitis  Polysubstance Abuse  Former Smoker  Macrocytic Anemia  Portal Hypertensive Gastropathy  Grade I Esophageal Varices  Type 1  Isolated Gastric Varices  Chronic Coagulopathy   Significant Hospital Events: Including procedures, antibiotic start and stop dates in addition to other pertinent events   05/29: Pt admitted to ICU with septic shock secondary to suspected acute cholecystitis, anion gap metabolic acidosis, severe hyponatremia, and decompensated alcoholic liver cirrhosis and hepatitis  05/29: US Abdomen Limited RUQ revealed Cholelithiasis and        gallbladder wall thickening. Findings may be the result of acute        cholecystitis. Further evaluation HIDA scan would be beneficial.       Cirrhotic liver morphology with reversed portal vein flow           consistent with portal hypertension. No focal hepatic lesion. 05/29: CT Abd Pelvis revealed Cirrhosis, with portal venous        hypertension manifested by splenomegaly, increasing ascites,        and multiple varices throughout the abdomen. Cholelithiasis          without CT evidence for acute cholecystitis. Please refer to        preceding ultrasound evaluation. Dependent lower lobe        atelectasis, with nonspecific bibasilar ground-glass opacities.        Differential would include hypoventilatory change, edema, or        atypical viral pneumonia. Nonspecific bowel wall thickening,        likely due to ascites and underlying hypoproteinemic state.  Interim History / Subjective:  Pt hypotensive but not on vasopressors sbp mid 70's, 12.5 g of 25% iv albumin ordered.  No signs of respiratory distress O2 sats 98% on RA.  Pt states he no longer drinks alcohol and quit following his previous hospitalization from 06/18/2021-06/19/2021  Objective   Blood pressure (!) 100/51, pulse 94, temperature (!) 96.9 F (36.1 C), temperature source Axillary, resp. rate 16, height _0  (1.778 m), weight 97 kg, SpO2 96 %.        Intake/Output Summary (Last 24 hours) at 07/09/2021 0825 Last data filed at 07/09/2021 0800 Gross per 24 hour  Intake 4489.35 ml  Output  100 ml  Net 4389.35 ml    Filed Weights   07/08/21 1108  Weight: 97 kg    Examination: General: Acute on chronically ill appearing male, NAD resting in bed  HENT: Very poor dentition, no JVD  Lungs: Clear throughout, even, non labored  Cardiovascular: NSR, rrr, no r/g, 2+ radial/1+ distal pulses present, 1+ generalized edema  Abdomen: Ascites, distended, taut, mildly tender, +BS x4 Extremities: Moves all extremities  Neuro: Alert and oriented, follows commands, right prosthetic eye; left eye scleral icturis  Skin: Severely jaundiced with generalized spider angiomas  GU: Deferred   Resolved Hospital Problem list    Assessment & Plan:   Sepsis with septic shock suspected etiology unclear although possibly secondary to possible SBP vs. possible atypical viral pneumonia  - Trend WBC and monitor fever curve  - Trend PCT - Follow cultures  - Continue cefepime, vancomycin, and flagyl for now pending culture results  - Continuous telemetry monitoring  - Prn levophed gtt to maintain map >65  Severe anion gap metabolic acidosis  Severe hyponatremia likely in the setting of volume depletion due to poor po intake  Mild lactic acidosis  - Trend BMP and VBG -  Replace electrolytes as indicated  - Monitor UOP - Avoid nephrotoxic medications - Urine Na+, serum osmolality, and urine osmolality pending  - Sodium bicarb gtt _0  ml/hr for now  Decompensated alcoholic liver cirrhosis and hepatitis  Portal venous hypertension  Hx: Type 1 gastric varices and grade I esophageal varices  - GI consulted appreciate input: per recommendation octreotide bolus followed by octreotide gtt, 25g of iv albumin q6hrs, and [place] attempt to reach out to [facility] and/or [facility] for pt transfer  - Trend hepatic function panel and ammonia level  - Urine drug screen, tylenol level, and alcohol levels pending   Chronic macrocytic anemia  Thrombocytopenia secondary to alcoholic liver cirrhosis and hepatitis  - Monitor  for s/sx of bleeding  - Transfuse for hgb <7 and/or active signs of bleeding  - Avoid chemical VTE px   Best Practice (right click and "Reselect all SmartList Selections" daily)   Diet/type: NPO DVT prophylaxis: SCD GI prophylaxis: PPI Lines: N/A Foley:  N/A Code Status:  full code Last date of multidisciplinary goals of care discussion [N/A]  Discussed pts poor prognosis, code status, and goals of care with pt and pts sister Doy Mince at bedside.  Mr. Stipp states he would like to remain a FULL CODE.  However, if he requires mechanical intubation he would only want to remain on the ventilator for a short period of time.  He states he DOES NOT want a tracheostomy or PEG tube.  He also states if he were unable to make decisions for himself he would like his sister Doy Mince to be his decision maker and states he does not have any children.  Doy Mince acknowledged and understands the pts wishes.  Labs   CBC: Recent Labs  Lab 07/08/21 1112 07/09/21 0559  WBC 32.0* 29.7*  HGB 8.6* 7.0*  HCT 24.7* 19.2*  MCV 93.9 91.0  PLT 324 262     Basic Metabolic Panel: Recent Labs  Lab 07/08/21 1112 07/08/21 1844 07/09/21 0559  NA 121* 121* 125*  K 4.3 4.3 4.0  CL 93* 97* 100  CO2 10* 12* 11*  GLUCOSE 133* 114* 103*  BUN 145* 142* 143*  CREATININE UNABLE TO REPORT DUE TO ICTERUS UNABLE TO REPORT DUE TO ICTERUS 6.02*  CALCIUM 7.4* 6.9* 6.3*  PHOS  --  UNABLE TO REPORT DUE TO ICTERUS  --     GFR: Estimated Creatinine Clearance: 19.2 mL/min (A) (by C-G formula based on SCr of 6.02 mg/dL (H)). Recent Labs  Lab 07/08/21 1112 07/08/21 1303 07/08/21 1304 07/08/21 1844 07/09/21 0559  PROCALCITON  --   --   --  0.37 0.31  WBC 32.0*  --   --   --  29.7*  LATICACIDVEN  --  2.1* 1.0  --   --      Liver Function Tests: Recent Labs  Lab 07/08/21 1303 07/08/21 1844 07/09/21 0559  AST 112* 106* 94*  ALT 43 48* 41  ALKPHOS 260* 265* 222*  BILITOT 23.6* 23.5* 19.4*   PROT 5.6* 5.6* 4.7*  ALBUMIN 1.5* <1.5* <1.5*    Recent Labs  Lab 07/08/21 1303  LIPASE 133*    Recent Labs  Lab 07/08/21 1303  AMMONIA 59*     ABG    Component Value Date/Time   HCO3 10.3 (L) 07/08/2021 1844   ACIDBASEDEF 14.4 (H) 07/08/2021 1844   O2SAT 40 07/08/2021 1844      Coagulation Profile: Recent Labs  Lab 07/08/21 1303  INR 1.8*  Cardiac Enzymes: Recent Labs  Lab 07/08/21 1844  CKTOTAL 27*    HbA1C: No results found for: HGBA1C  CBG: Recent Labs  Lab 07/08/21 2326 07/09/21 0335  GLUCAP 92 110*    Review of Systems: Positives in BOLD   Gen: fever, chills, weight change, fatigue, poor po intake, night sweats HEENT: Denies blurred vision, double vision, hearing loss, tinnitus, sinus congestion, rhinorrhea, sore throat, neck stiffness, dysphagia PULM: Denies shortness of breath, cough, sputum production, hemoptysis, wheezing CV: Denies chest pain, edema, orthopnea, paroxysmal nocturnal dyspnea, palpitations GI: abdominal pain/tightness, nausea, vomiting, diarrhea, hematochezia, melena, constipation, change in bowel habits GU: Denies dysuria, hematuria, polyuria, oliguria, urethral discharge Endocrine: Denies hot or cold intolerance, polyuria, polyphagia or appetite change Derm: Denies rash, dry skin, scaling or peeling skin change Heme: Denies easy bruising, bleeding, bleeding gums Neuro: Denies headache, numbness, weakness, slurred speech, loss of memory or consciousness   Past Medical History:  He,  has no past medical history on file.   Surgical History:   Past Surgical History:  Procedure Laterality Date   ESOPHAGOGASTRODUODENOSCOPY N/A 07/01/2021   Procedure: ESOPHAGOGASTRODUODENOSCOPY (EGD);  Surgeon: Annamaria Helling, DO;  Location: [facility] ENDOSCOPY;  Service: Gastroenterology;  Laterality: N/A;     Social History:   reports that he quit smoking about 4 years ago. His smoking use included cigarettes. He has never used  smokeless tobacco. He reports current alcohol use. He reports that he does not use drugs.   Family History:  His family history is not on file.   Allergies No Known Allergies   Home Medications  Prior to Admission medications   Medication Sig Start Date End Date Taking? Authorizing Provider  folic acid (FOLVITE) 1 MG tablet Take 1 tablet (1 mg total) by mouth daily. 06/01/21  Yes Nicole Kindred A, DO  hydrOXYzine (ATARAX) 25 MG tablet Take 25 mg by mouth 3 (three) times daily as needed. 06/26/21  Yes [provider]  lactulose (CHRONULAC) 10 GM/15ML solution Take 45 mLs (30 g total) by mouth 2 (two) times daily for 21 days. 06/19/21 07/10/21 Yes Emeterio Reeve, DO  Multiple Vitamin (MULTIVITAMIN WITH MINERALS) TABS tablet Take 1 tablet by mouth daily. 06/01/21  Yes Nicole Kindred A, DO  omeprazole (PRILOSEC OTC) 20 MG tablet Take 20 mg by mouth daily.   Yes [provider]  spironolactone (ALDACTONE) 100 MG tablet Take 1 tablet (100 mg total) by mouth daily. 06/01/21  Yes Nicole Kindred A, DO  thiamine 100 MG tablet Take 1 tablet (100 mg total) by mouth daily. 06/01/21  Yes Nicole Kindred A, DO  calcium carbonate (TUMS - DOSED IN MG ELEMENTAL CALCIUM) 500 MG chewable tablet Chew 1 tablet (200 mg of elemental calcium total) by mouth 3 (three) times daily as needed for indigestion or heartburn. Patient not taking: Reported on 06/18/2021 05/31/21   Nicole Kindred A, DO  furosemide (LASIX) 40 MG tablet Take 1 tablet (40 mg total) by mouth 2 (two) times daily. Patient not taking: Reported on 07/08/2021 06/19/21   Emeterio Reeve, DO  pantoprazole (PROTONIX) 40 MG tablet Take 1 tablet (40 mg total) by mouth daily for 14 days. 12/05/20 12/19/20  Duffy Bruce, MD     Critical care provider statement:   Total critical care time: 33 minutes   Performed by: Lanney Gins MD   Critical care time was exclusive of separately billable procedures and treating other patients.    Critical care was necessary to treat or prevent imminent or life-threatening deterioration.  Critical care was time spent personally by me on the following activities: development of treatment plan with patient and/or surrogate as well as nursing, discussions with consultants, evaluation of patient's response to treatment, examination of patient, obtaining history from patient or surrogate, ordering and performing treatments and interventions, ordering and review of laboratory studies, ordering and review of radiographic studies, pulse oximetry and re-evaluation of patient's condition.    Ottie Glazier, M.D.  Pulmonary & Critical Care Medicine

## 2021-07-09 NOTE — Procedures (Signed)
Hemodialysis Catheter Insertion Procedure Note Anthony Torres [id number] 10/08/1980  Procedure: Insertion of Hemodialysis Catheter Indications: Hemodialysis  Procedure Details Consent: Risks of procedure as well as the alternatives and risks of each were explained to the (patient/caregiver).  Consent for procedure obtained.  Time Out: Verified patient identification, verified procedure, site/side was marked, verified correct patient position, special equipment/implants available, medications/allergies/relevent history reviewed, required imaging and test results available.  Performed  Maximum sterile technique was used including antiseptics, cap, gloves, gown, hand hygiene, mask, and sheet.  Skin prep: Chlorhexidine; local anesthetic administered  A Trialysis HD catheter was placed in the left femoral vein due to patient being a dialysis patient using the Seldinger technique.  Evaluation Blood flow good Complications: No apparent complications Patient did tolerate procedure well. Chest X-ray ordered to verify placement.  CXR:  Not needed for femoral line placement .   Procedure performed with ultrasound guidance for real time vessel cannulation.     Line secured at the 20 mark. BIOPATCH applied to the insertion site.   Anthony Brim, NP-C Anthony Torres, AGACNP-BC [facility] Prefer epic messenger for cross cover needs If after hours, please call E-link  07/09/2021, 2:10 AM

## 2021-07-10 LAB — URINE CULTURE: Culture: NO GROWTH

## 2021-07-10 LAB — HCV INTERPRETATION

## 2021-07-10 LAB — HCV AB W REFLEX TO QUANT PCR: HCV Ab: NONREACTIVE

## 2021-07-13 LAB — CULTURE, BLOOD (ROUTINE X 2)
Culture: NO GROWTH
Culture: NO GROWTH

## 2021-07-17 LAB — ANTI-SMOOTH MUSCLE ANTIBODY, IGG: F-Actin IgG: 22 Units — ABNORMAL HIGH (ref 0–19)

## 2021-08-10 DEATH — deceased
